# Patient Record
Sex: Female | Born: 1935 | Race: White | Hispanic: No | Smoking: Former smoker
Health system: Southern US, Community
[De-identification: ages and names within clinical notes are randomized; demographics above are authoritative.]

## PROBLEM LIST (undated history)

## (undated) DIAGNOSIS — R51 Headache: Secondary | ICD-10-CM

## (undated) DIAGNOSIS — R519 Headache, unspecified: Secondary | ICD-10-CM

## (undated) DIAGNOSIS — E079 Disorder of thyroid, unspecified: Secondary | ICD-10-CM

## (undated) DIAGNOSIS — M1990 Unspecified osteoarthritis, unspecified site: Secondary | ICD-10-CM

## (undated) DIAGNOSIS — I714 Abdominal aortic aneurysm, without rupture, unspecified: Secondary | ICD-10-CM

## (undated) DIAGNOSIS — Z9289 Personal history of other medical treatment: Secondary | ICD-10-CM

## (undated) DIAGNOSIS — I10 Essential (primary) hypertension: Secondary | ICD-10-CM

## (undated) DIAGNOSIS — K219 Gastro-esophageal reflux disease without esophagitis: Secondary | ICD-10-CM

## (undated) DIAGNOSIS — M810 Age-related osteoporosis without current pathological fracture: Secondary | ICD-10-CM

## (undated) DIAGNOSIS — J42 Unspecified chronic bronchitis: Secondary | ICD-10-CM

## (undated) DIAGNOSIS — T7840XA Allergy, unspecified, initial encounter: Secondary | ICD-10-CM

## (undated) DIAGNOSIS — E785 Hyperlipidemia, unspecified: Secondary | ICD-10-CM

## (undated) DIAGNOSIS — K635 Polyp of colon: Secondary | ICD-10-CM

## (undated) HISTORY — DX: Abdominal aortic aneurysm, without rupture, unspecified: I71.40

## (undated) HISTORY — DX: Unspecified osteoarthritis, unspecified site: M19.90

## (undated) HISTORY — DX: Disorder of thyroid, unspecified: E07.9

## (undated) HISTORY — PX: SHOULDER ARTHROSCOPY: SHX128

## (undated) HISTORY — DX: Polyp of colon: K63.5

## (undated) HISTORY — DX: Abdominal aortic aneurysm, without rupture: I71.4

## (undated) HISTORY — DX: Allergy, unspecified, initial encounter: T78.40XA

## (undated) HISTORY — DX: Hyperlipidemia, unspecified: E78.5

## (undated) HISTORY — DX: Essential (primary) hypertension: I10

## (undated) HISTORY — DX: Gastro-esophageal reflux disease without esophagitis: K21.9

## (undated) HISTORY — DX: Personal history of other medical treatment: Z92.89

## (undated) HISTORY — PX: NO PAST SURGERIES: SHX2092

## (undated) HISTORY — DX: Headache: R51

## (undated) HISTORY — DX: Unspecified chronic bronchitis: J42

## (undated) HISTORY — DX: Age-related osteoporosis without current pathological fracture: M81.0

## (undated) HISTORY — DX: Headache, unspecified: R51.9

## (undated) HISTORY — PX: HIP ARTHROPLASTY: SHX981

---

## 2016-12-28 DIAGNOSIS — M1990 Unspecified osteoarthritis, unspecified site: Secondary | ICD-10-CM | POA: Diagnosis not present

## 2017-01-20 DIAGNOSIS — J019 Acute sinusitis, unspecified: Secondary | ICD-10-CM | POA: Diagnosis not present

## 2017-04-02 DIAGNOSIS — J069 Acute upper respiratory infection, unspecified: Secondary | ICD-10-CM | POA: Diagnosis not present

## 2017-04-02 DIAGNOSIS — J209 Acute bronchitis, unspecified: Secondary | ICD-10-CM | POA: Diagnosis not present

## 2017-04-06 DIAGNOSIS — Z0000 Encounter for general adult medical examination without abnormal findings: Secondary | ICD-10-CM | POA: Diagnosis not present

## 2017-04-15 DIAGNOSIS — R05 Cough: Secondary | ICD-10-CM | POA: Diagnosis not present

## 2017-04-15 DIAGNOSIS — J209 Acute bronchitis, unspecified: Secondary | ICD-10-CM | POA: Diagnosis not present

## 2017-04-15 DIAGNOSIS — J069 Acute upper respiratory infection, unspecified: Secondary | ICD-10-CM | POA: Diagnosis not present

## 2017-04-19 DIAGNOSIS — K219 Gastro-esophageal reflux disease without esophagitis: Secondary | ICD-10-CM | POA: Diagnosis not present

## 2017-04-19 DIAGNOSIS — M1990 Unspecified osteoarthritis, unspecified site: Secondary | ICD-10-CM | POA: Diagnosis not present

## 2017-04-19 DIAGNOSIS — I10 Essential (primary) hypertension: Secondary | ICD-10-CM | POA: Diagnosis not present

## 2017-05-05 DIAGNOSIS — I714 Abdominal aortic aneurysm, without rupture: Secondary | ICD-10-CM | POA: Diagnosis not present

## 2017-06-07 DIAGNOSIS — Z1231 Encounter for screening mammogram for malignant neoplasm of breast: Secondary | ICD-10-CM | POA: Diagnosis not present

## 2017-06-23 DIAGNOSIS — M25511 Pain in right shoulder: Secondary | ICD-10-CM | POA: Diagnosis not present

## 2017-06-23 DIAGNOSIS — Z96611 Presence of right artificial shoulder joint: Secondary | ICD-10-CM | POA: Diagnosis not present

## 2017-06-23 DIAGNOSIS — M4722 Other spondylosis with radiculopathy, cervical region: Secondary | ICD-10-CM | POA: Diagnosis not present

## 2017-06-29 DIAGNOSIS — M4722 Other spondylosis with radiculopathy, cervical region: Secondary | ICD-10-CM | POA: Diagnosis not present

## 2017-07-07 DIAGNOSIS — M4722 Other spondylosis with radiculopathy, cervical region: Secondary | ICD-10-CM | POA: Diagnosis not present

## 2017-08-24 DIAGNOSIS — Z23 Encounter for immunization: Secondary | ICD-10-CM | POA: Diagnosis not present

## 2017-10-28 DIAGNOSIS — E039 Hypothyroidism, unspecified: Secondary | ICD-10-CM | POA: Diagnosis not present

## 2017-10-28 DIAGNOSIS — E7800 Pure hypercholesterolemia, unspecified: Secondary | ICD-10-CM | POA: Diagnosis not present

## 2017-10-28 DIAGNOSIS — I10 Essential (primary) hypertension: Secondary | ICD-10-CM | POA: Diagnosis not present

## 2018-01-18 ENCOUNTER — Encounter: Payer: Self-pay | Admitting: Family Medicine

## 2018-01-18 ENCOUNTER — Ambulatory Visit (INDEPENDENT_AMBULATORY_CARE_PROVIDER_SITE_OTHER): Payer: Medicare Other | Admitting: Family Medicine

## 2018-01-18 VITALS — BP 120/72 | HR 67 | Temp 98.5°F | Ht 65.5 in | Wt 204.5 lb

## 2018-01-18 DIAGNOSIS — E039 Hypothyroidism, unspecified: Secondary | ICD-10-CM | POA: Diagnosis not present

## 2018-01-18 DIAGNOSIS — J302 Other seasonal allergic rhinitis: Secondary | ICD-10-CM

## 2018-01-18 DIAGNOSIS — I10 Essential (primary) hypertension: Secondary | ICD-10-CM | POA: Insufficient documentation

## 2018-01-18 DIAGNOSIS — E785 Hyperlipidemia, unspecified: Secondary | ICD-10-CM | POA: Insufficient documentation

## 2018-01-18 DIAGNOSIS — R062 Wheezing: Secondary | ICD-10-CM | POA: Diagnosis not present

## 2018-01-18 MED ORDER — FLUTICASONE PROPIONATE HFA 110 MCG/ACT IN AERO: 2.0000 | INHALATION_SPRAY | Freq: Two times a day (BID) | RESPIRATORY_TRACT | 12 refills | Status: DC

## 2018-01-18 MED ORDER — ALBUTEROL SULFATE HFA 108 (90 BASE) MCG/ACT IN AERS: 1.0000 | INHALATION_SPRAY | Freq: Four times a day (QID) | RESPIRATORY_TRACT | 0 refills | Status: AC | PRN

## 2018-01-18 NOTE — Progress Notes (Signed)
Pre visit review using our clinic review tool, if applicable. No additional management support is needed unless otherwise documented below in the visit note. 

## 2018-01-18 NOTE — Progress Notes (Signed)
Chief Complaint  Patient presents with  . Establish Care       New Patient Visit SUBJECTIVE: HPI: Dawn Bush is an 84 y.o.female who is being seen for establishing care.  The patient was previously seen at [facility].  For several weeks, the patient is an issue with her allergies and wheezing.  She does not currently feel like she is wheezing.  She does not have a known history of asthma.  She has received albuterol and inhaled steroid in the past that has been helpful for such symptoms.  She is taking Allegra intermittently and has Flonase at home.  She is not using either routinely.  Denies fevers, sore throat, ear pain/drainage, myalgias, cough, or current shortness of breath/wheezing.  Hypertension Patient presents for hypertension follow up. She does monitor home blood pressures. She is compliant with medications. Patient has these side effects of medication: none She is sometimes adhering to a healthy diet overall. Exercise: none   Hyperlipidemia Patient presents for dyslipidemia follow up. Currently being treated with LIpitor 40 mg/d, and compliance with treatment thus far has been good. She denies myalgias. She is not adhering to a healthy. The patient exercises never.  The patient is not known to have coexisting coronary artery disease.  Hypothyroidism Patient presents for follow-up of hypothyroidism.  Reports compliance with medication. Current symptoms include: none Denies: denies fatigue, weight changes, heat/cold intolerance, bowel/skin changes or CVS symptoms She believes her dose should be unchanged  She is on Lexapro for anxiety which she started after stopping smoking cigarettes.  She is cut down from 20 mg daily to 10 mg daily.  She does not wish to cut down any further.  Allergies  Allergen Reactions  . Azithromycin Other (See Comments)    Past Medical History:  Diagnosis Date  . Allergy   . Arthritis   . Chronic bronchitis ([place])   . Colon polyps    . GERD (gastroesophageal reflux disease)   . Headache   . History of blood transfusion   . Hyperlipidemia   . Hypertension   . Thyroid disease    History reviewed. No pertinent surgical history. Social History   Socioeconomic History  . Marital status: Widowed  Tobacco Use  . Smoking status: Former Smoker    Types: Cigarettes  . Smokeless tobacco: Never Used  Substance and Sexual Activity  . Alcohol use: No    Frequency: Never  . Drug use: No   History reviewed. No pertinent family history.   Current Outpatient Medications:  .  amLODipine (NORVASC) 5 MG tablet, Take 5 mg by mouth daily., Disp: , Rfl:  .  atorvastatin (LIPITOR) 40 MG tablet, Take 40 mg by mouth daily., Disp: , Rfl:  .  escitalopram (LEXAPRO) 10 MG tablet, Take 10 mg by mouth daily., Disp: , Rfl:  .  hyoscyamine (LEVBID) 0.375 MG 12 hr tablet, Take 0.375 mg by mouth daily., Disp: , Rfl:  .  levothyroxine (SYNTHROID, LEVOTHROID) 112 MCG tablet, Take 112 mcg by mouth daily before breakfast., Disp: , Rfl:  .  loratadine (ALLERGY) 10 MG tablet, Take 10 mg by mouth daily., Disp: , Rfl:  .  meloxicam (MOBIC) 7.5 MG tablet, Take 7.5 mg by mouth daily., Disp: , Rfl:  .  metoprolol tartrate (LOPRESSOR) 50 MG tablet, Take 50 mg by mouth 2 (two) times daily., Disp: , Rfl:  .  omeprazole (PRILOSEC) 20 MG capsule, Take 20 mg by mouth daily., Disp: , Rfl:  .  albuterol (VENTOLIN HFA)  108 (90 Base) MCG/ACT inhaler, Inhale 1-2 puffs into the lungs every 6 (six) hours as needed for wheezing or shortness of breath., Disp: 1 Inhaler, Rfl: 0 .  fluticasone (FLOVENT HFA) 110 MCG/ACT inhaler, Inhale 2 puffs into the lungs 2 (two) times daily., Disp: 1 Inhaler, Rfl: 12  ROS 10 point review of system is negative unless otherwise noted in the HPI  OBJECTIVE: BP 120/72 (BP Location: Left Arm, Patient Position: Sitting, Cuff Size: Large)   Pulse 67   Temp 98.5 F (36.9 C) (Oral)   Ht 5' 5.5" (1.664 m)   Wt 204 lb 8 oz (92.8 kg)    SpO2 95%   BMI 33.51 kg/m   Constitutional: -  VS reviewed -  Well developed, well nourished, appears stated age -  No apparent distress  Psychiatric: -  Oriented to person, place, and time -  Memory intact -  Affect and mood normal -  Fluent conversation, good eye contact -  Judgment and insight age appropriate  Eye: -  Conjunctivae clear, no discharge -  Pupils symmetric, round, reactive to light  ENMT: -  MMM    Pharynx moist, no exudate, no erythema -  Ears neg b/l -  Nares patent, rhinorrhea noted b/l  Neck: -  No gross swelling, no palpable masses -  Thyroid midline, not enlarged, mobile, no palpable masses  Cardiovascular: -  RRR -  No LE edema  Respiratory: -  Normal respiratory effort, no accessory muscle use, no retraction -  Breath sounds equal, no wheezes, no ronchi, no crackles  Skin: -  No significant lesion on inspection -  Warm and dry to palpation   ASSESSMENT/PLAN: Seasonal allergies - Plan: albuterol (VENTOLIN HFA) 108 (90 Base) MCG/ACT inhaler, fluticasone (FLOVENT HFA) 110 MCG/ACT inhaler  Wheezing - Plan: albuterol (VENTOLIN HFA) 108 (90 Base) MCG/ACT inhaler, fluticasone (FLOVENT HFA) 110 MCG/ACT inhaler  Essential hypertension - Plan: metoprolol tartrate (LOPRESSOR) 50 MG tablet, amLODipine (NORVASC) 5 MG tablet  Hyperlipidemia, unspecified hyperlipidemia type - Plan: atorvastatin (LIPITOR) 40 MG tablet  Hypothyroidism, unspecified type - Plan: levothyroxine (SYNTHROID, LEVOTHROID) 112 MCG tablet  Patient instructed to sign release of records form from her previous PCP. Stronger oral antihistamine suggested, start Flonase.  Albuterol given for as needed use regarding breathing and wheezing.  If she is using this frequently, can use the inhaled corticosteroid.  She was advised to rinse her mouth out after use. Patient should return in 6 mo for med check. The patient voiced understanding and agreement to the plan.   [place],  DO 01/18/18  4:49 PM

## 2018-01-18 NOTE — Patient Instructions (Signed)
Claritin (loratadine), Allegra (fexofenadine), Zyrtec (cetirizine); these are listed in order from weakest to strongest. Generic, and therefore cheaper, options are in the parentheses.   Flonase (fluticasone); nasal spray that is over the counter. 2 sprays each nostril, once daily. Aim towards the same side eye when you spray.  There are available OTC, and the generic versions, which may be cheaper, are in parentheses. Show this to a pharmacist if you have trouble finding any of these items.  Remember to rinse your mouth out after using the inhaled steroid.   Use the albuterol (rescue inhaler) if you feel like you are wheezing or short of breath.  Call if you need refills, don't make an appointment. That is what the 6 month follow up is for.   Let us know if you need anything.

## 2018-01-19 ENCOUNTER — Encounter: Payer: Self-pay | Admitting: Family Medicine

## 2018-01-24 DIAGNOSIS — J209 Acute bronchitis, unspecified: Secondary | ICD-10-CM | POA: Diagnosis not present

## 2018-01-26 ENCOUNTER — Telehealth: Payer: Self-pay | Admitting: *Deleted

## 2018-01-26 NOTE — Telephone Encounter (Signed)
Received Medical records from [facility]; forwarded to provider/SLS 03/28

## 2018-02-14 ENCOUNTER — Telehealth: Payer: Self-pay | Admitting: Family Medicine

## 2018-02-14 DIAGNOSIS — E039 Hypothyroidism, unspecified: Secondary | ICD-10-CM

## 2018-02-14 NOTE — Telephone Encounter (Signed)
Copied from [place]. Topic: Quick Communication - Rx Refill/Question >> Feb 14, 2018  3:06 PM Synthia Innocent wrote: Medication: meloxicam (MOBIC) 7.5 MG tablet and levothyroxine (SYNTHROID, LEVOTHROID) 112 MCG tablet   Has the patient contacted their pharmacy? No, provider has never filled before  (Agent: If no, request that the patient contact the pharmacy for the refill.) Preferred Pharmacy (with phone number or street name): [facility]  Agent: Please be advised that RX refills may take up to 3 business days. We ask that you follow-up with your pharmacy.

## 2018-02-15 MED ORDER — LEVOTHYROXINE SODIUM 112 MCG PO TABS: 112.0000 ug | ORAL_TABLET | Freq: Every day | ORAL | 3 refills | Status: DC

## 2018-02-15 MED ORDER — MELOXICAM 7.5 MG PO TABS: 7.5000 mg | ORAL_TABLET | Freq: Every day | ORAL | 1 refills | Status: DC

## 2018-02-15 NOTE — Telephone Encounter (Signed)
Pt requesting meloxicam and levothyroxine. Both are listed on the her med list but no provider listed. LOV  01/18/18 as new patient visit. pcp   Dr. Nani Ravens  Please review.

## 2018-02-15 NOTE — Telephone Encounter (Signed)
Refills done/Dr. Nani Ravens is her provider. Called the patient to inform refills are done.

## 2018-03-10 ENCOUNTER — Telehealth: Payer: Self-pay | Admitting: Family Medicine

## 2018-03-10 DIAGNOSIS — E785 Hyperlipidemia, unspecified: Secondary | ICD-10-CM

## 2018-03-10 DIAGNOSIS — I10 Essential (primary) hypertension: Secondary | ICD-10-CM

## 2018-03-10 DIAGNOSIS — E039 Hypothyroidism, unspecified: Secondary | ICD-10-CM

## 2018-03-10 MED ORDER — HYOSCYAMINE SULFATE ER 0.375 MG PO TB12: 0.3750 mg | ORAL_TABLET | Freq: Every day | ORAL | 1 refills | Status: DC

## 2018-03-10 MED ORDER — METOPROLOL TARTRATE 50 MG PO TABS: 50.0000 mg | ORAL_TABLET | Freq: Two times a day (BID) | ORAL | 1 refills | Status: DC

## 2018-03-10 MED ORDER — OMEPRAZOLE 20 MG PO CPDR: 20.0000 mg | DELAYED_RELEASE_CAPSULE | Freq: Every day | ORAL | 1 refills | Status: DC

## 2018-03-10 MED ORDER — ATORVASTATIN CALCIUM 40 MG PO TABS: 40.0000 mg | ORAL_TABLET | Freq: Every day | ORAL | 1 refills | Status: DC

## 2018-03-10 MED ORDER — AMLODIPINE BESYLATE 5 MG PO TABS: 5.0000 mg | ORAL_TABLET | Freq: Every day | ORAL | 1 refills | Status: DC

## 2018-03-10 MED ORDER — LEVOTHYROXINE SODIUM 112 MCG PO TABS: 112.0000 ug | ORAL_TABLET | Freq: Every day | ORAL | 1 refills | Status: DC

## 2018-03-10 MED ORDER — MELOXICAM 7.5 MG PO TABS: 7.5000 mg | ORAL_TABLET | Freq: Every day | ORAL | 1 refills | Status: DC

## 2018-03-10 MED ORDER — ESCITALOPRAM OXALATE 10 MG PO TABS: 10.0000 mg | ORAL_TABLET | Freq: Every day | ORAL | 1 refills | Status: DC

## 2018-03-10 NOTE — Telephone Encounter (Signed)
OK to refill. TY.

## 2018-03-10 NOTE — Telephone Encounter (Signed)
Copied from [place] [id number]. Topic: Quick Communication - Rx Refill/Question >> Mar 10, 2018 10:05 AM Synthia Innocent wrote: Medication: levothyroxine (SYNTHROID, LEVOTHROID) 112 MCG tablet, escitalopram (LEXAPRO) 10 MG tablet, metoprolol tartrate (LOPRESSOR) 50 MG tablet, atorvastatin (LIPITOR) 40 MG tablet, meloxicam (MOBIC) 7.5 MG tablet, amLODipine (NORVASC) 5 MG tablet, hyoscyamine (LEVBID) 0.375 MG 12 hr, and omeprazole (PRILOSEC) 20 MG capsule  Has the patient contacted their pharmacy? Yes, new prescriber (Agent: If no, request that the patient contact the pharmacy for the refill.) Preferred Pharmacy (with phone number or street name): [facility] [address] Agent: Please be advised that RX refills may take up to 3 business days. We ask that you follow-up with your pharmacy.

## 2018-03-10 NOTE — Telephone Encounter (Signed)
Refills sent in as requested. 

## 2018-03-10 NOTE — Addendum Note (Signed)
Addended by: Sharon Seller B on: 03/10/2018 11:52 AM   Modules accepted: Orders

## 2018-05-01 DIAGNOSIS — R52 Pain, unspecified: Secondary | ICD-10-CM | POA: Diagnosis not present

## 2018-05-02 DIAGNOSIS — Z96642 Presence of left artificial hip joint: Secondary | ICD-10-CM | POA: Diagnosis not present

## 2018-05-02 DIAGNOSIS — I10 Essential (primary) hypertension: Secondary | ICD-10-CM | POA: Diagnosis not present

## 2018-05-02 DIAGNOSIS — Z885 Allergy status to narcotic agent status: Secondary | ICD-10-CM | POA: Diagnosis not present

## 2018-05-02 DIAGNOSIS — R0789 Other chest pain: Secondary | ICD-10-CM | POA: Diagnosis not present

## 2018-05-02 DIAGNOSIS — Z79899 Other long term (current) drug therapy: Secondary | ICD-10-CM | POA: Diagnosis not present

## 2018-05-02 DIAGNOSIS — Z96612 Presence of left artificial shoulder joint: Secondary | ICD-10-CM | POA: Diagnosis not present

## 2018-05-02 DIAGNOSIS — M5489 Other dorsalgia: Secondary | ICD-10-CM | POA: Diagnosis not present

## 2018-05-02 DIAGNOSIS — S20212A Contusion of left front wall of thorax, initial encounter: Secondary | ICD-10-CM | POA: Diagnosis not present

## 2018-05-02 DIAGNOSIS — I959 Hypotension, unspecified: Secondary | ICD-10-CM | POA: Diagnosis not present

## 2018-05-02 DIAGNOSIS — R079 Chest pain, unspecified: Secondary | ICD-10-CM | POA: Diagnosis not present

## 2018-05-02 DIAGNOSIS — R0781 Pleurodynia: Secondary | ICD-10-CM | POA: Diagnosis not present

## 2018-05-02 DIAGNOSIS — Z791 Long term (current) use of non-steroidal anti-inflammatories (NSAID): Secondary | ICD-10-CM | POA: Diagnosis not present

## 2018-05-02 DIAGNOSIS — G8911 Acute pain due to trauma: Secondary | ICD-10-CM | POA: Diagnosis not present

## 2018-05-02 DIAGNOSIS — Z96611 Presence of right artificial shoulder joint: Secondary | ICD-10-CM | POA: Diagnosis not present

## 2018-05-15 ENCOUNTER — Ambulatory Visit: Payer: Self-pay | Admitting: Family Medicine

## 2018-05-15 ENCOUNTER — Emergency Department (HOSPITAL_BASED_OUTPATIENT_CLINIC_OR_DEPARTMENT_OTHER): Payer: Medicare Other

## 2018-05-15 ENCOUNTER — Encounter (HOSPITAL_BASED_OUTPATIENT_CLINIC_OR_DEPARTMENT_OTHER): Payer: Self-pay | Admitting: Emergency Medicine

## 2018-05-15 ENCOUNTER — Emergency Department (HOSPITAL_BASED_OUTPATIENT_CLINIC_OR_DEPARTMENT_OTHER)
Admission: EM | Admit: 2018-05-15 | Discharge: 2018-05-15 | Disposition: A | Discharge status: Home / Self Care | Payer: Medicare Other | Attending: Emergency Medicine | Admitting: Emergency Medicine

## 2018-05-15 ENCOUNTER — Other Ambulatory Visit: Payer: Self-pay

## 2018-05-15 DIAGNOSIS — R0789 Other chest pain: Secondary | ICD-10-CM | POA: Diagnosis not present

## 2018-05-15 DIAGNOSIS — Y939 Activity, unspecified: Secondary | ICD-10-CM | POA: Insufficient documentation

## 2018-05-15 DIAGNOSIS — Y999 Unspecified external cause status: Secondary | ICD-10-CM | POA: Diagnosis not present

## 2018-05-15 DIAGNOSIS — E039 Hypothyroidism, unspecified: Secondary | ICD-10-CM | POA: Diagnosis not present

## 2018-05-15 DIAGNOSIS — M79602 Pain in left arm: Secondary | ICD-10-CM

## 2018-05-15 DIAGNOSIS — Z87891 Personal history of nicotine dependence: Secondary | ICD-10-CM | POA: Diagnosis not present

## 2018-05-15 DIAGNOSIS — Y9234 Swimming pool (public) as the place of occurrence of the external cause: Secondary | ICD-10-CM | POA: Diagnosis not present

## 2018-05-15 DIAGNOSIS — Z79899 Other long term (current) drug therapy: Secondary | ICD-10-CM | POA: Insufficient documentation

## 2018-05-15 DIAGNOSIS — M25512 Pain in left shoulder: Secondary | ICD-10-CM | POA: Diagnosis not present

## 2018-05-15 DIAGNOSIS — W010XXD Fall on same level from slipping, tripping and stumbling without subsequent striking against object, subsequent encounter: Secondary | ICD-10-CM | POA: Insufficient documentation

## 2018-05-15 DIAGNOSIS — I10 Essential (primary) hypertension: Secondary | ICD-10-CM | POA: Insufficient documentation

## 2018-05-15 DIAGNOSIS — S4992XA Unspecified injury of left shoulder and upper arm, initial encounter: Secondary | ICD-10-CM | POA: Diagnosis not present

## 2018-05-15 NOTE — ED Provider Notes (Signed)
[facility] EMERGENCY DEPARTMENT Provider Note   CSN: [id number] Arrival date & time: 05/15/18  0932     History   Chief Complaint Chief Complaint  Patient presents with  . Fall    HPI Dawn Bush is a 84 y.o. female.  84 year old female with past medical history below including hypertension, hyperlipidemia, GERD who presents with left chest wall and left arm pain.  2 weeks ago, she was at the pool and slipped and fell onto her left side.  Since then, she has had a moderate to severe, constant left chest wall pain and left arm pain that goes down her whole arm.  She did not strike her head or lose consciousness and has had no vomiting or vision changes.  She was evaluated at an urgent care the following day.  She continued to have pain so several days later presented to an ER where she had normal CT of the chest.  She has been taking meloxicam with no relief of her symptoms.  She returns today because her pain is still present.  No other areas of pain.  No anticoagulant use.  No breathing problems although deep breathing makes pain worse.  The history is provided by the patient.  Fall     Past Medical History:  Diagnosis Date  . Allergy   . Arthritis   . Chronic bronchitis ([place])   . Colon polyps   . GERD (gastroesophageal reflux disease)   . Headache   . History of blood transfusion   . Hyperlipidemia   . Hypertension   . Thyroid disease     Patient Active Problem List   Diagnosis Date Noted  . Essential hypertension 01/18/2018  . Hyperlipidemia 01/18/2018  . Hypothyroidism 01/18/2018    Past Surgical History:  Procedure Laterality Date  . NO PAST SURGERIES       OB History   None      Home Medications    Prior to Admission medications   Medication Sig Start Date End Date Taking? Authorizing Provider  albuterol (VENTOLIN HFA) 108 (90 Base) MCG/ACT inhaler Inhale 1-2 puffs into the lungs every 6 (six) hours as needed for wheezing or shortness  of breath. 01/18/18   Wendling, Crosby Oyster, DO  amLODipine (NORVASC) 5 MG tablet Take 1 tablet (5 mg total) by mouth daily. 03/10/18   Shelda Pal, DO  atorvastatin (LIPITOR) 40 MG tablet Take 1 tablet (40 mg total) by mouth daily. 03/10/18   Shelda Pal, DO  escitalopram (LEXAPRO) 10 MG tablet Take 1 tablet (10 mg total) by mouth daily. 03/10/18   Shelda Pal, DO  fluticasone (FLOVENT HFA) 110 MCG/ACT inhaler Inhale 2 puffs into the lungs 2 (two) times daily. 01/18/18   Shelda Pal, DO  hyoscyamine (LEVBID) 0.375 MG 12 hr tablet Take 1 tablet (0.375 mg total) by mouth daily. 03/10/18   Shelda Pal, DO  levothyroxine (SYNTHROID, LEVOTHROID) 112 MCG tablet Take 1 tablet (112 mcg total) by mouth daily before breakfast. 03/10/18   Wendling, Crosby Oyster, DO  loratadine (ALLERGY) 10 MG tablet Take 10 mg by mouth daily.    [provider]  meloxicam (MOBIC) 7.5 MG tablet Take 1 tablet (7.5 mg total) by mouth daily. 03/10/18   Shelda Pal, DO  metoprolol tartrate (LOPRESSOR) 50 MG tablet Take 1 tablet (50 mg total) by mouth 2 (two) times daily. 03/10/18   Shelda Pal, DO  omeprazole (PRILOSEC) 20 MG capsule Take 1 capsule (  20 mg total) by mouth daily. 03/10/18   Shelda Pal, DO    Family History Family History  Problem Relation Age of Onset  . Cancer Neg Hx     Social History Social History   Tobacco Use  . Smoking status: Former Smoker    Types: Cigarettes  . Smokeless tobacco: Never Used  Substance Use Topics  . Alcohol use: No    Frequency: Never  . Drug use: No     Allergies   Azithromycin   Review of Systems Review of Systems All other systems reviewed and are negative except that which was mentioned in HPI   Physical Exam Updated Vital Signs BP (!) 146/60 (BP Location: Right Arm)   Pulse 98   Temp 97.9 F (36.6 C) (Oral)   Ht 5\' 5"  (1.651 m)   Wt 90.7 kg (200 lb)   SpO2  92%   BMI 33.28 kg/m   Physical Exam  Constitutional: She is oriented to person, place, and time. She appears well-developed and well-nourished. No distress.  HENT:  Head: Normocephalic and atraumatic.  Moist mucous membranes  Eyes: Conjunctivae are normal.  Neck: Neck supple.  Cardiovascular: Normal rate, regular rhythm and normal heart sounds.  No murmur heard. Pulmonary/Chest: Effort normal and breath sounds normal. She exhibits no tenderness.  Abdominal: Soft. Bowel sounds are normal. She exhibits no distension. There is no tenderness.  Musculoskeletal: Normal range of motion. She exhibits no edema or tenderness.  Pain with raising shoulder above 90 degrees  Neurological: She is alert and oriented to person, place, and time.  Fluent speech  Skin: Skin is warm and dry.  Peeling skin from sunburn on upper back  Psychiatric: She has a normal mood and affect.  Nursing note and vitals reviewed.    ED Treatments / Results  Labs (all labs ordered are listed, but only abnormal results are displayed) Labs Reviewed - No data to display  EKG None  Radiology Dg Shoulder Left  Result Date: 05/15/2018 CLINICAL DATA:  [place] 2 weeks ago and tried to catch herself with LEFT arm and landed on LEFT side, pain all over LEFT shoulder EXAM: LEFT SHOULDER - 2+ VIEW COMPARISON:  None FINDINGS: Osseous demineralization. Question mild widening of AC joint, acromion margin poorly visualized. LEFT shoulder prosthesis. No acute fracture, dislocation, or bone destruction. No periprosthetic lucency or LEFT rib abnormality. IMPRESSION: LEFT shoulder prosthesis and osseous demineralization without definite acute bony findings. Electronically Signed   By: Lavonia Dana M.D.   On: 05/15/2018 09:10    Procedures Procedures (including critical care time)  Medications Ordered in ED Medications - No data to display   Initial Impression / Assessment and Plan / ED Course  I have reviewed the triage vital  signs and the nursing notes.  Pertinent labs & imaging results that were available during my care of the patient were reviewed by me and considered in my medical decision making (see chart for details).     Reassuring VS, no focal chest wall tenderness on exam. I reviewed OSH work up which shows normal CT chest, no rib fractures. Given this, I do not feel she needs chest imaging today as symptoms are the same ongoing sx. I did obtain L shoulder XR which was negative acute. Discussed supportive measures, provided w/ IS to help prevent pneumonia, and recommended adding tylenol to meloxicam regimen at home. Return precautions reviewed.  Final Clinical Impressions(s) / ED Diagnoses   Final diagnoses:  Left-sided chest wall pain  Left arm pain    ED Discharge Orders    None       Little, Wenda Overland, MD 05/15/18 [phone]

## 2018-05-15 NOTE — Telephone Encounter (Signed)
Called in c/o her whole left arm being numb since she woke up this morning.  She can feel things and hold a glass of water with left arm.   She can move it fine it's "just numb".  She fell at the pool 2 weeks ago on her left side.   She had her arm x-rayed at the urgent care and everything was fine.   Her ribs on the left side are sore too.   She was not getting better so went to the ED on 05/02/18 in [place].    They did a CT scan and everything was fine.   She is concerned she is having this left arm numbness that is new.  I have referred her to the ED.   She is going to the [facility] ED now.  I routed a note to Dr. Nani Ravens making him aware.    Reason for Disposition . [1] Numbness (i.e., loss of sensation) of the face, arm / hand, or leg / foot on one side of the body AND [2] sudden onset AND [3] present now  Answer Assessment - Initial Assessment Questions 1. SYMPTOM: "What is the main symptom you are concerned about?" (e.g., weakness, numbness)     Left arm feeling numb. 2. ONSET: "When did this start?" (minutes, hours, days; while sleeping)     I woke up with it numb this morning.   My whole arm is numb. 3. LAST NORMAL: "When was the last time you were normal (no symptoms)?"     Last night it was fine. 4. PATTERN "Does this come and go, or has it been constant since it started?"  "Is it present now?"     Constant.   No tingling. 5. CARDIAC SYMPTOMS: "Have you had any of the following symptoms: chest pain, difficulty breathing, palpitations?"     No stroke history.   My heart skips beats.  I'm on medication for it.   When I breath it hurts because of my ribs.  I fell at the pool 2 weeks ago.   I went to urgent care and nothing was broken.   It got worse on Wed. So I went to [facility] and the CT scan showed everything is ok.   I don't know why I'm still hurting.   It's been 15 days. 6. NEUROLOGIC SYMPTOMS: "Have you had any of the following symptoms: headache,  dizziness, vision loss, double vision, changes in speech, unsteady on your feet?"     No other symptoms. 7. OTHER SYMPTOMS: "Do you have any other symptoms?"     Bruised my left hip when fell and bruises on my arm.   They x-rayed my left arm and everything was ok.   8. PREGNANCY: "Is there any chance you are pregnant?" "When was your last menstrual period?"     Not asked due to age  Protocols used: NEUROLOGIC DEFICIT-A-AH

## 2018-05-15 NOTE — ED Notes (Signed)
ED Provider at bedside. 

## 2018-05-15 NOTE — ED Notes (Signed)
IS given to patient. Stated she had used one in the past. 1250x5. Placed in a bag and encourage to use at home.

## 2018-05-15 NOTE — ED Triage Notes (Signed)
Pt fell 2 weeks ago and sts she was seen and x-rayed  With  No injuries found.  Continues to have pain in left side of chest, left breast,left side of upper back and left arm.  There is no swelling or deformity. Skin is intact.  Denies any lower body involvement. Moves left arm well.

## 2018-05-17 ENCOUNTER — Telehealth: Payer: Self-pay | Admitting: *Deleted

## 2018-05-17 DIAGNOSIS — H53483 Generalized contraction of visual field, bilateral: Secondary | ICD-10-CM | POA: Diagnosis not present

## 2018-05-17 DIAGNOSIS — H43813 Vitreous degeneration, bilateral: Secondary | ICD-10-CM | POA: Diagnosis not present

## 2018-05-17 DIAGNOSIS — H353111 Nonexudative age-related macular degeneration, right eye, early dry stage: Secondary | ICD-10-CM | POA: Diagnosis not present

## 2018-05-17 DIAGNOSIS — H16223 Keratoconjunctivitis sicca, not specified as Sjogren's, bilateral: Secondary | ICD-10-CM | POA: Diagnosis not present

## 2018-05-17 NOTE — Telephone Encounter (Signed)
Received Medical records from [facility]; forwarded to provider/SLS 07/17

## 2018-05-18 ENCOUNTER — Telehealth: Payer: Self-pay | Admitting: Family Medicine

## 2018-05-18 NOTE — Telephone Encounter (Signed)
PCV 04-02-16 Tdap 04-02-16 Z[place] 10-31-14 Reportedly UTD w imms?

## 2018-06-02 ENCOUNTER — Telehealth: Payer: Self-pay | Admitting: Family Medicine

## 2018-06-02 DIAGNOSIS — I719 Aortic aneurysm of unspecified site, without rupture: Secondary | ICD-10-CM

## 2018-06-02 DIAGNOSIS — Z0000 Encounter for general adult medical examination without abnormal findings: Secondary | ICD-10-CM

## 2018-06-02 NOTE — Telephone Encounter (Signed)
Copied from [place] [id number]. Topic: Quick Communication - See Telephone Encounter >> Jun 02, 2018  5:16 PM Neva Seat wrote: Pt needing to be referred to a vein vascular doctor and for a mammogram.

## 2018-06-05 NOTE — Telephone Encounter (Signed)
Pt. requesting mammogram and referral for vein vascular. Routed to Dr. Nani Ravens to review.

## 2018-06-07 NOTE — Telephone Encounter (Signed)
We can order a mammogram, however do not recommend screening after the age of 76.  Her last mammogram was normal.  If she still would like it,  okay to place order.  I am okay to refer to a vascular surgeon, however would need to know why.

## 2018-06-07 NOTE — Addendum Note (Signed)
Addended by: Sharon Seller B on: 06/07/2018 01:04 PM   Modules accepted: Orders

## 2018-06-07 NOTE — Telephone Encounter (Signed)
Referral done. Patient aware

## 2018-06-20 ENCOUNTER — Other Ambulatory Visit: Payer: Self-pay

## 2018-06-20 DIAGNOSIS — I714 Abdominal aortic aneurysm, without rupture, unspecified: Secondary | ICD-10-CM

## 2018-08-11 DIAGNOSIS — Z87891 Personal history of nicotine dependence: Secondary | ICD-10-CM | POA: Diagnosis not present

## 2018-08-11 DIAGNOSIS — K219 Gastro-esophageal reflux disease without esophagitis: Secondary | ICD-10-CM | POA: Diagnosis not present

## 2018-08-15 ENCOUNTER — Other Ambulatory Visit: Payer: Self-pay

## 2018-08-15 ENCOUNTER — Encounter: Payer: Self-pay | Admitting: Vascular Surgery

## 2018-08-15 ENCOUNTER — Ambulatory Visit (HOSPITAL_COMMUNITY)
Admission: RE | Admit: 2018-08-15 | Discharge: 2018-08-15 | Disposition: A | Discharge status: Home / Self Care | Payer: Medicare Other | Source: Ambulatory Visit | Attending: Vascular Surgery | Admitting: Vascular Surgery

## 2018-08-15 ENCOUNTER — Ambulatory Visit (INDEPENDENT_AMBULATORY_CARE_PROVIDER_SITE_OTHER): Payer: Medicare Other | Admitting: Vascular Surgery

## 2018-08-15 VITALS — BP 110/60 | HR 54 | Temp 98.4°F | Resp 16 | Ht 65.0 in | Wt 199.6 lb

## 2018-08-15 DIAGNOSIS — I714 Abdominal aortic aneurysm, without rupture, unspecified: Secondary | ICD-10-CM
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
## 2018-09-07 DIAGNOSIS — R05 Cough: Secondary | ICD-10-CM | POA: Diagnosis not present

## 2018-09-12 DIAGNOSIS — J189 Pneumonia, unspecified organism: Secondary | ICD-10-CM | POA: Diagnosis not present

## 2018-10-11 ENCOUNTER — Other Ambulatory Visit: Payer: Self-pay | Admitting: Family Medicine

## 2018-10-11 NOTE — Telephone Encounter (Signed)
Copied from [place] [id number]. Topic: Quick Communication - Rx Refill/Question >> Oct 11, 2018  9:59 AM Percell Belt A wrote: Medication: escitalopram (LEXAPRO) 10 MG tablet [[id number]]   Has the patient contacted their pharmacy? Yes- pt stated that pharmacy told her it has been denied , pt is unsure why it was denied  (Agent: If no, request that the patient contact the pharmacy for the refill.) (Agent: If yes, when and what did the pharmacy advise?)  Preferred Pharmacy (with phone number or street name): [id number] - [place], [place] - [address] AT [address] [place] [phone] (Phone   Agent: Please be advised that RX refills may take up to 3 business days. We ask that you follow-up with your pharmacy.

## 2018-10-12 MED ORDER — ESCITALOPRAM OXALATE 10 MG PO TABS: 10.0000 mg | ORAL_TABLET | Freq: Every day | ORAL | 0 refills | Status: DC

## 2018-10-12 NOTE — Telephone Encounter (Signed)
30 day refill approved. TC to patient appointment made for 10/19/18 to adhere to protocol standards for refills on this medication. Requested Prescriptions  Pending Prescriptions Disp Refills  . escitalopram (LEXAPRO) 10 MG tablet 30 tablet 0    Sig: Take 1 tablet (10 mg total) by mouth daily.     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

## 2018-10-17 ENCOUNTER — Other Ambulatory Visit: Payer: Self-pay | Admitting: Family Medicine

## 2018-10-18 NOTE — Progress Notes (Signed)
Subjective:   Dawn Bush is a 84 y.o. female who presents for an Initial Medicare Annual Wellness Visit.  Review of Systems   No ROS.  Medicare Wellness Visit. Additional risk factors are reflected in the social history. Cardiac Risk Factors include: advanced age (>21men, >73 women);dyslipidemia;hypertension Sleep patterns:  Home Safety/Smoke Alarms: Feels safe in home. Smoke alarms in place. Lives alone in 1st floor apt. Pt has 2 dogs. Step over tub with grab rails.   Female:        Mammo- ordered   Dexa scan- ordered          Objective:    Today's Vitals   10/19/18 1354  BP: 132/82  Pulse: (!) 56  SpO2: 93%  Weight: 199 lb (90.3 kg)  Height: 5\' 5"  (1.651 m)   Body mass index is 33.12 kg/m.  Advanced Directives 10/19/2018 08/15/2018 05/15/2018  Does Patient Have a Medical Advance Directive? No Yes No  Type of Advance Directive - [place] -  Does patient want to make changes to medical advance directive? - No - Patient declined -  Copy of [place] in Chart? - No - copy requested -  Would patient like information on creating a medical advance directive? No - Patient declined - No - Patient declined    Current Medications (verified) Outpatient Encounter Medications as of 10/19/2018  Medication Sig  . albuterol (VENTOLIN HFA) 108 (90 Base) MCG/ACT inhaler Inhale 1-2 puffs into the lungs every 6 (six) hours as needed for wheezing or shortness of breath.  Marland Kitchen amLODipine (NORVASC) 5 MG tablet Take 1 tablet (5 mg total) by mouth daily.  Marland Kitchen atorvastatin (LIPITOR) 40 MG tablet Take 1 tablet (40 mg total) by mouth daily.  Marland Kitchen escitalopram (LEXAPRO) 10 MG tablet Take 1 tablet (10 mg total) by mouth daily.  . hyoscyamine (LEVBID) 0.375 MG 12 hr tablet Take 1 tablet (0.375 mg total) by mouth daily.  Marland Kitchen levothyroxine (SYNTHROID, LEVOTHROID) 112 MCG tablet Take 1 tablet (112 mcg total) by mouth daily before breakfast.  . meloxicam (MOBIC) 7.5 MG  tablet TAKE 1 TABLET BY MOUTH EVERY DAY  . metoprolol tartrate (LOPRESSOR) 50 MG tablet Take 1 tablet (50 mg total) by mouth 2 (two) times daily.  Marland Kitchen omeprazole (PRILOSEC) 20 MG capsule Take 1 capsule (20 mg total) by mouth 2 (two) times daily before a meal.  . umeclidinium bromide (INCRUSE ELLIPTA) 62.5 MCG/INH AEPB Inhale 1 puff into the lungs daily.  . [DISCONTINUED] amLODipine (NORVASC) 5 MG tablet Take 1 tablet (5 mg total) by mouth daily.  . [DISCONTINUED] atorvastatin (LIPITOR) 40 MG tablet Take 1 tablet (40 mg total) by mouth daily.  . [DISCONTINUED] escitalopram (LEXAPRO) 10 MG tablet Take 1 tablet (10 mg total) by mouth daily.  . [DISCONTINUED] fluticasone (FLOVENT HFA) 110 MCG/ACT inhaler Inhale 2 puffs into the lungs 2 (two) times daily.  . [DISCONTINUED] levothyroxine (SYNTHROID, LEVOTHROID) 112 MCG tablet Take 1 tablet (112 mcg total) by mouth daily before breakfast.  . [DISCONTINUED] loratadine (ALLERGY) 10 MG tablet Take 10 mg by mouth daily.  . [DISCONTINUED] metoprolol tartrate (LOPRESSOR) 50 MG tablet Take 1 tablet (50 mg total) by mouth 2 (two) times daily.  . [DISCONTINUED] omeprazole (PRILOSEC) 20 MG capsule Take 1 capsule (20 mg total) by mouth daily.   No facility-administered encounter medications on file as of 10/19/2018.     Allergies (verified) Erythromycin; Other; Codeine; Azithromycin; Banana; and Prednisone   History: Past Medical History:  Diagnosis Date  . AAA (abdominal aortic aneurysm) ([place])   . Allergy   . Arthritis   . Chronic bronchitis ([place])   . Colon polyps   . GERD (gastroesophageal reflux disease)   . Headache   . History of blood transfusion   . Hyperlipidemia   . Hypertension   . Thyroid disease    Past Surgical History:  Procedure Laterality Date  . HIP ARTHROPLASTY Left   . NO PAST SURGERIES    . SHOULDER ARTHROSCOPY Bilateral    Family History  Problem Relation Age of Onset  . Heart disease Father   . AAA (abdominal aortic  aneurysm) Father   . Heart disease Brother   . AAA (abdominal aortic aneurysm) Brother   . Cancer Neg Hx    Social History   Socioeconomic History  . Marital status: Widowed    Spouse name: Not on file  . Number of children: Not on file  . Years of education: Not on file  . Highest education level: Not on file  Occupational History  . Not on file  Social Needs  . Financial resource strain: Not on file  . Food insecurity:    Worry: Not on file    Inability: Not on file  . Transportation needs:    Medical: Not on file    Non-medical: Not on file  Tobacco Use  . Smoking status: Former Smoker    Types: Cigarettes    Last attempt to quit: 11/02/1991    Years since quitting: 26.9  . Smokeless tobacco: Never Used  Substance and Sexual Activity  . Alcohol use: No    Frequency: Never  . Drug use: No  . Sexual activity: Not on file  Lifestyle  . Physical activity:    Days per week: Not on file    Minutes per session: Not on file  . Stress: Not on file  Relationships  . Social connections:    Talks on phone: Not on file    Gets together: Not on file    Attends religious service: Not on file    Active member of club or organization: Not on file    Attends meetings of clubs or organizations: Not on file    Relationship status: Not on file  Other Topics Concern  . Not on file  Social History Narrative  . Not on file    Tobacco Counseling Counseling given: Not Answered   Clinical Intake:     Pain : No/denies pain                  Activities of Daily Living In your present state of health, do you have any difficulty performing the following activities: 10/19/2018  Hearing? N  Vision? N  Comment wearing glasses.  Difficulty concentrating or making decisions? N  Comment likes crossword puzzles.  Walking or climbing stairs? Y  Comment gets SOB  Dressing or bathing? N  Doing errands, shopping? N  Preparing Food and eating ? N  Using the Toilet? N  In  the past six months, have you accidently leaked urine? N  Do you have problems with loss of bowel control? N  Managing your Medications? N  Managing your Finances? N  Housekeeping or managing your Housekeeping? N  Some recent data might be hidden     Immunizations and Health Maintenance Immunization History  Administered Date(s) Administered  . Influenza-Unspecified 08/01/2017, 06/07/2018  . Tdap 10/19/2018  . Zoster 08/01/2013   Health Maintenance Due  Topic Date Due  . TETANUS/TDAP  05/20/1953  . DEXA SCAN  05/19/2001  . PNA vac Low Risk Adult (1 of 2 - PCV13) 05/19/2001    Patient Care Team: Shelda Pal, DO as PCP - General (Family Medicine)  Indicate any recent Medical Services you may have received from other than [facility] providers in the past year (date may be approximate).     Assessment:   This is a routine wellness examination for Nili. Physical assessment deferred to PCP.  Hearing/Vision screen  Visual Acuity Screening   Right eye Left eye Both eyes  Without correction:     With correction: 20/25 20/25 20/25   Hearing Screening Comments: Able to hear conversational tones w/o difficulty. No issues reported.    Dietary issues and exercise activities discussed: Current Exercise Habits: The patient does not participate in regular exercise at present, Exercise limited by: None identified Diet (meal preparation, eat out, water intake, caffeinated beverages, dairy products, fruits and vegetables): 24 hour recall Breakfast: cereal Lunch: salad Dinner: chicken soup  Goals    . Increase physical activity      Depression Screen PHQ 2/9 Scores 10/19/2018  PHQ - 2 Score 0    Fall Risk Fall Risk  10/19/2018  Falls in the past year? 1  Number falls in past yr: 0  Injury with Fall? 1    Cognitive Function: MMSE - Mini Mental State Exam 10/19/2018  Orientation to time 5  Orientation to Place 5  Registration 3  Attention/ Calculation 5  Recall 1    Language- name 2 objects 2  Language- repeat 1  Language- follow 3 step command 3  Language- read & follow direction 1  Write a sentence 1  Copy design 1  Total score 28        Screening Tests Health Maintenance  Topic Date Due  . TETANUS/TDAP  05/20/1953  . DEXA SCAN  05/19/2001  . PNA vac Low Risk Adult (1 of 2 - PCV13) 05/19/2001  . INFLUENZA VACCINE  Completed     Plan:    Please schedule your next medicare wellness visit with me in 1 yr.  Continue to eat heart healthy diet (full of fruits, vegetables, whole grains, lean protein, water--limit salt, fat, and sugar intake) and increase physical activity as tolerated.  Continue doing brain stimulating activities (puzzles, reading, adult coloring books, staying active) to keep memory sharp.   Bring a copy of your living will and/or healthcare power of attorney to your next office visit.    I have personally reviewed and noted the following in the patient's chart:   . Medical and social history . Use of alcohol, tobacco or illicit drugs  . Current medications and supplements . Functional ability and status . Nutritional status . Physical activity . Advanced directives . List of other physicians . Hospitalizations, surgeries, and ER visits in previous 12 months . Vitals . Screenings to include cognitive, depression, and falls . Referrals and appointments  In addition, I have reviewed and discussed with patient certain preventive protocols, quality metrics, and best practice recommendations. A written personalized care plan for preventive services as well as general preventive health recommendations were provided to patient.     Shela Nevin, [place]   10/19/2018

## 2018-10-19 ENCOUNTER — Ambulatory Visit (INDEPENDENT_AMBULATORY_CARE_PROVIDER_SITE_OTHER): Payer: Medicare Other | Admitting: Family Medicine

## 2018-10-19 ENCOUNTER — Encounter: Payer: Self-pay | Admitting: Family Medicine

## 2018-10-19 ENCOUNTER — Encounter: Payer: Self-pay | Admitting: *Deleted

## 2018-10-19 ENCOUNTER — Ambulatory Visit (INDEPENDENT_AMBULATORY_CARE_PROVIDER_SITE_OTHER): Payer: Medicare Other | Admitting: *Deleted

## 2018-10-19 VITALS — BP 132/82 | HR 56 | Ht 65.0 in | Wt 199.0 lb

## 2018-10-19 VITALS — BP 132/82 | HR 56 | Temp 98.7°F | Ht 65.0 in | Wt 199.0 lb

## 2018-10-19 DIAGNOSIS — Z0000 Encounter for general adult medical examination without abnormal findings: Secondary | ICD-10-CM

## 2018-10-19 DIAGNOSIS — Z1239 Encounter for other screening for malignant neoplasm of breast: Secondary | ICD-10-CM | POA: Diagnosis not present

## 2018-10-19 DIAGNOSIS — E785 Hyperlipidemia, unspecified: Secondary | ICD-10-CM

## 2018-10-19 DIAGNOSIS — J42 Unspecified chronic bronchitis: Secondary | ICD-10-CM | POA: Insufficient documentation

## 2018-10-19 DIAGNOSIS — E039 Hypothyroidism, unspecified: Secondary | ICD-10-CM

## 2018-10-19 DIAGNOSIS — E2839 Other primary ovarian failure: Secondary | ICD-10-CM | POA: Diagnosis not present

## 2018-10-19 DIAGNOSIS — I10 Essential (primary) hypertension: Secondary | ICD-10-CM

## 2018-10-19 DIAGNOSIS — Z23 Encounter for immunization: Secondary | ICD-10-CM

## 2018-10-19 LAB — TSH: TSH: 1.07 u[IU]/mL (ref 0.35–4.50)

## 2018-10-19 LAB — COMPREHENSIVE METABOLIC PANEL
ALK PHOS: 89 U/L (ref 39–117)
ALT: 15 U/L (ref 0–35)
AST: 21 U/L (ref 0–37)
Albumin: 4.3 g/dL (ref 3.5–5.2)
BILIRUBIN TOTAL: 0.7 mg/dL (ref 0.2–1.2)
BUN: 17 mg/dL (ref 6–23)
CO2: 32 mEq/L (ref 19–32)
Calcium: 9.4 mg/dL (ref 8.4–10.5)
Chloride: 102 mEq/L (ref 96–112)
Creatinine, Ser: 1.12 mg/dL (ref 0.40–1.20)
GFR: 49.45 mL/min — AB (ref >60.00)
GLUCOSE: 89 mg/dL (ref 70–99)
Potassium: 5 mEq/L (ref 3.5–5.1)
SODIUM: 141 meq/L (ref 135–145)
TOTAL PROTEIN: 6.6 g/dL (ref 6.0–8.3)

## 2018-10-19 LAB — LIPID PANEL
Cholesterol: 126 mg/dL (ref 0–200)
HDL: 34.6 mg/dL — AB (ref >39.00)
LDL Cholesterol: 63 mg/dL (ref 0–99)
NONHDL: 91.14
Total CHOL/HDL Ratio: 4
Triglycerides: 140 mg/dL (ref 0.0–149.0)
VLDL: 28 mg/dL (ref 0.0–40.0)

## 2018-10-19 MED ORDER — UMECLIDINIUM BROMIDE 62.5 MCG/INH IN AEPB: 1.0000 | INHALATION_SPRAY | Freq: Every day | RESPIRATORY_TRACT | 5 refills | Status: AC

## 2018-10-19 MED ORDER — OMEPRAZOLE 20 MG PO CPDR: 20.0000 mg | DELAYED_RELEASE_CAPSULE | Freq: Two times a day (BID) | ORAL | 2 refills | Status: AC

## 2018-10-19 MED ORDER — ATORVASTATIN CALCIUM 40 MG PO TABS: 40.0000 mg | ORAL_TABLET | Freq: Every day | ORAL | 2 refills | Status: DC

## 2018-10-19 MED ORDER — METOPROLOL TARTRATE 50 MG PO TABS: 50.0000 mg | ORAL_TABLET | Freq: Two times a day (BID) | ORAL | 2 refills | Status: DC

## 2018-10-19 MED ORDER — LEVOTHYROXINE SODIUM 112 MCG PO TABS: 112.0000 ug | ORAL_TABLET | Freq: Every day | ORAL | 2 refills | Status: AC

## 2018-10-19 MED ORDER — ESCITALOPRAM OXALATE 10 MG PO TABS: 10.0000 mg | ORAL_TABLET | Freq: Every day | ORAL | 2 refills | Status: DC

## 2018-10-19 MED ORDER — AMLODIPINE BESYLATE 5 MG PO TABS: 5.0000 mg | ORAL_TABLET | Freq: Every day | ORAL | 2 refills | Status: DC

## 2018-10-19 NOTE — Patient Instructions (Addendum)
Make sure to call your insurance company regarding the cost for your mammogram. I don't think they will cover it.  Let me know if you need refills before seeing your new provider in [place].  Let me know if inhaler is too expensive.   Keep the diet clean and stay active.  Get back in the pool!   Let us know if you need anything.

## 2018-10-19 NOTE — Patient Instructions (Signed)
Please schedule your next medicare wellness visit with me in 1 yr.  Continue to eat heart healthy diet (full of fruits, vegetables, whole grains, lean protein, water--limit salt, fat, and sugar intake) and increase physical activity as tolerated.  Continue doing brain stimulating activities (puzzles, reading, adult coloring books, staying active) to keep memory sharp.   Bring a copy of your living will and/or healthcare power of attorney to your next office visit.   Dawn Bush , Thank you for taking time to come for your Medicare Wellness Visit. I appreciate your ongoing commitment to your health goals. Please review the following plan we discussed and let me know if I can assist you in the future.   These are the goals we discussed: Goals    . Increase physical activity       This is a list of the screening recommended for you and due dates:  Health Maintenance  Topic Date Due  . Tetanus Vaccine  05/20/1953  . DEXA scan (bone density measurement)  05/19/2001  . Pneumonia vaccines (1 of 2 - PCV13) 05/19/2001  . Flu Shot  Completed    Health Maintenance for Postmenopausal Women Menopause is a normal process in which your reproductive ability comes to an end. This process happens gradually over a span of months to years, usually between the ages of 42 and 33. Menopause is complete when you have missed 12 consecutive menstrual periods. It is important to talk with your health care provider about some of the most common conditions that affect postmenopausal women, such as heart disease, cancer, and bone loss (osteoporosis). Adopting a healthy lifestyle and getting preventive care can help to promote your health and wellness. Those actions can also lower your chances of developing some of these common conditions. What should I know about menopause? During menopause, you may experience a number of symptoms, such as:  Moderate-to-severe hot flashes.  Night sweats.  Decrease in sex  drive.  Mood swings.  Headaches.  Tiredness.  Irritability.  Memory problems.  Insomnia. Choosing to treat or not to treat menopausal changes is an individual decision that you make with your health care provider. What should I know about hormone replacement therapy and supplements? Hormone therapy products are effective for treating symptoms that are associated with menopause, such as hot flashes and night sweats. Hormone replacement carries certain risks, especially as you become older. If you are thinking about using estrogen or estrogen with progestin treatments, discuss the benefits and risks with your health care provider. What should I know about heart disease and stroke? Heart disease, heart attack, and stroke become more likely as you age. This may be due, in part, to the hormonal changes that your body experiences during menopause. These can affect how your body processes dietary fats, triglycerides, and cholesterol. Heart attack and stroke are both medical emergencies. There are many things that you can do to help prevent heart disease and stroke:  Have your blood pressure checked at least every 1-2 years. High blood pressure causes heart disease and increases the risk of stroke.  If you are 10-46 years old, ask your health care provider if you should take aspirin to prevent a heart attack or a stroke.  Do not use any tobacco products, including cigarettes, chewing tobacco, or electronic cigarettes. If you need help quitting, ask your health care provider.  It is important to eat a healthy diet and maintain a healthy weight. ? Be sure to include plenty of vegetables, fruits, low-fat  dairy products, and lean protein. ? Avoid eating foods that are high in solid fats, added sugars, or salt (sodium).  Get regular exercise. This is one of the most important things that you can do for your health. ? Try to exercise for at least 150 minutes each week. The type of exercise that you  do should increase your heart rate and make you sweat. This is known as moderate-intensity exercise. ? Try to do strengthening exercises at least twice each week. Do these in addition to the moderate-intensity exercise.  Know your numbers.Ask your health care provider to check your cholesterol and your blood glucose. Continue to have your blood tested as directed by your health care provider.  What should I know about cancer screening? There are several types of cancer. Take the following steps to reduce your risk and to catch any cancer development as early as possible. Breast Cancer  Practice breast self-awareness. ? This means understanding how your breasts normally appear and feel. ? It also means doing regular breast self-exams. Let your health care provider know about any changes, no matter how small.  If you are 70 or older, have a clinician do a breast exam (clinical breast exam or CBE) every year. Depending on your age, family history, and medical history, it may be recommended that you also have a yearly breast X-ray (mammogram).  If you have a family history of breast cancer, talk with your health care provider about genetic screening.  If you are at high risk for breast cancer, talk with your health care provider about having an MRI and a mammogram every year.  Breast cancer (BRCA) gene test is recommended for women who have family members with BRCA-related cancers. Results of the assessment will determine the need for genetic counseling and BRCA1 and for BRCA2 testing. BRCA-related cancers include these types: ? Breast. This occurs in males or females. ? Ovarian. ? Tubal. This may also be called fallopian tube cancer. ? Cancer of the abdominal or pelvic lining (peritoneal cancer). ? Prostate. ? Pancreatic. Cervical, Uterine, and Ovarian Cancer Your health care provider may recommend that you be screened regularly for cancer of the pelvic organs. These include your ovaries,  uterus, and vagina. This screening involves a pelvic exam, which includes checking for microscopic changes to the surface of your cervix (Pap test).  For women ages 21-65, health care providers may recommend a pelvic exam and a Pap test every three years. For women ages 67-65, they may recommend the Pap test and pelvic exam, combined with testing for human papilloma virus (HPV), every five years. Some types of HPV increase your risk of cervical cancer. Testing for HPV may also be done on women of any age who have unclear Pap test results.  Other health care providers may not recommend any screening for nonpregnant women who are considered low risk for pelvic cancer and have no symptoms. Ask your health care provider if a screening pelvic exam is right for you.  If you have had past treatment for cervical cancer or a condition that could lead to cancer, you need Pap tests and screening for cancer for at least 20 years after your treatment. If Pap tests have been discontinued for you, your risk factors (such as having a new sexual partner) need to be reassessed to determine if you should start having screenings again. Some women have medical problems that increase the chance of getting cervical cancer. In these cases, your health care provider may recommend that  you have screening and Pap tests more often.  If you have a family history of uterine cancer or ovarian cancer, talk with your health care provider about genetic screening.  If you have vaginal bleeding after reaching menopause, tell your health care provider.  There are currently no reliable tests available to screen for ovarian cancer. Lung Cancer Lung cancer screening is recommended for adults 4-82 years old who are at high risk for lung cancer because of a history of smoking. A yearly low-dose CT scan of the lungs is recommended if you:  Currently smoke.  Have a history of at least 30 pack-years of smoking and you currently smoke or have  quit within the past 15 years. A pack-year is smoking an average of one pack of cigarettes per day for one year. Yearly screening should:  Continue until it has been 15 years since you quit.  Stop if you develop a health problem that would prevent you from having lung cancer treatment. Colorectal Cancer  This type of cancer can be detected and can often be prevented.  Routine colorectal cancer screening usually begins at age 43 and continues through age 55.  If you have risk factors for colon cancer, your health care provider may recommend that you be screened at an earlier age.  If you have a family history of colorectal cancer, talk with your health care provider about genetic screening.  Your health care provider may also recommend using home test kits to check for hidden blood in your stool.  A small camera at the end of a tube can be used to examine your colon directly (sigmoidoscopy or colonoscopy). This is done to check for the earliest forms of colorectal cancer.  Direct examination of the colon should be repeated every 5-10 years until age 30. However, if early forms of precancerous polyps or small growths are found or if you have a family history or genetic risk for colorectal cancer, you may need to be screened more often. Skin Cancer  Check your skin from head to toe regularly.  Monitor any moles. Be sure to tell your health care provider: ? About any new moles or changes in moles, especially if there is a change in a mole's shape or color. ? If you have a mole that is larger than the size of a pencil eraser.  If any of your family members has a history of skin cancer, especially at a young age, talk with your health care provider about genetic screening.  Always use sunscreen. Apply sunscreen liberally and repeatedly throughout the day.  Whenever you are outside, protect yourself by wearing long sleeves, pants, a wide-brimmed hat, and sunglasses. What should I know  about osteoporosis? Osteoporosis is a condition in which bone destruction happens more quickly than new bone creation. After menopause, you may be at an increased risk for osteoporosis. To help prevent osteoporosis or the bone fractures that can happen because of osteoporosis, the following is recommended:  If you are 92-45 years old, get at least 1,000 mg of calcium and at least 600 mg of vitamin D per day.  If you are older than age 30 but younger than age 32, get at least 1,200 mg of calcium and at least 600 mg of vitamin D per day.  If you are older than age 42, get at least 1,200 mg of calcium and at least 800 mg of vitamin D per day. Smoking and excessive alcohol intake increase the risk of osteoporosis. Eat foods  that are rich in calcium and vitamin D, and do weight-bearing exercises several times each week as directed by your health care provider. What should I know about how menopause affects my mental health? Depression may occur at any age, but it is more common as you become older. Common symptoms of depression include:  Low or sad mood.  Changes in sleep patterns.  Changes in appetite or eating patterns.  Feeling an overall lack of motivation or enjoyment of activities that you previously enjoyed.  Frequent crying spells. Talk with your health care provider if you think that you are experiencing depression. What should I know about immunizations? It is important that you get and maintain your immunizations. These include:  Tetanus, diphtheria, and pertussis (Tdap) booster vaccine.  Influenza every year before the flu season begins.  Pneumonia vaccine.  Shingles vaccine. Your health care provider may also recommend other immunizations. This information is not intended to replace advice given to you by your health care provider. Make sure you discuss any questions you have with your health care provider. Document Released: 12/10/2005 Document Revised: 05/07/2016 Document  Reviewed: 07/22/2015 Elsevier Interactive Patient Education  2019 [employer].

## 2018-10-19 NOTE — Progress Notes (Signed)
Noted. Agree with above.  [place], DO 10/19/18 3:03 PM

## 2018-10-19 NOTE — Progress Notes (Signed)
Chief Complaint  Patient presents with  . Follow-up    Subjective Dawn Bush is a 84 y.o. female who presents for hypertension follow up. She does not monitor home blood pressures. She is compliant with medications- Norvasc 5 mg/d, Lopressor 50 mg bid. Patient has these side effects of medication: none She is adhering to a healthy diet overall. Current exercise: none  Hyperlipidemia Patient presents for dyslipidemia follow up. Currently being treated with Lipitor 40 mg/d and compliance with treatment thus far has been good. She denies myalgias. She is adhering to a healthy. Exercise: none The patient is not known to have coexisting coronary artery disease.  Hx of chronic bronchitis, currently on Flovent. Feels sob, wondering if it is because she is not physically active. No cough or wheezing, not using rescue inhaler.   Requesting mammogram. Has had it paid for in past, would like it ordered.  Has not had DEXA in >2 yrs.   Past Medical History:  Diagnosis Date  . AAA (abdominal aortic aneurysm) ([place])   . Allergy   . Arthritis   . Chronic bronchitis ([place])   . Colon polyps   . GERD (gastroesophageal reflux disease)   . Headache   . History of blood transfusion   . Hyperlipidemia   . Hypertension   . Thyroid disease     Review of Systems Cardiovascular: no chest pain Respiratory:  +shortness of breath as noted above  Exam BP 132/82 (BP Location: Left Arm, Patient Position: Sitting, Cuff Size: Normal)   Pulse (!) 56   Temp 98.7 F (37.1 C) (Oral)   Ht 5\' 5"  (1.651 m)   Wt 199 lb (90.3 kg)   SpO2 93%   BMI 33.12 kg/m  General:  well developed, well nourished, in no apparent distress Heart: RRR, no bruits, no LE edema Lungs: clear to auscultation, no accessory muscle use Psych: well oriented with normal range of affect and appropriate judgment/insight  Essential hypertension - Plan: amLODipine (NORVASC) 5 MG tablet, metoprolol tartrate (LOPRESSOR) 50 MG  tablet, Comprehensive metabolic panel  Hyperlipidemia, unspecified hyperlipidemia type - Plan: atorvastatin (LIPITOR) 40 MG tablet, TSH, Lipid panel, Comprehensive metabolic panel  Hypothyroidism, unspecified type - Plan: levothyroxine (SYNTHROID, LEVOTHROID) 112 MCG tablet, TSH  Screening for breast cancer - Plan: MM 3D SCREEN BREAST BILATERAL  Chronic bronchitis, unspecified chronic bronchitis type ([place]) - Plan: umeclidinium bromide (INCRUSE ELLIPTA) 62.5 MCG/INH AEPB  Estrogen deficiency - Plan: DG Bone Density  Orders as above. Counseled on diet and exercise. She was told multiple times to ck w ins prior to getting mammogram. Waiver printed.  Stop Flovent, go on LAMA.  F/u in 6 mo- moving so we may not see her again. The patient voiced understanding and agreement to the plan.  [place], DO 10/19/18  2:01 PM

## 2018-10-19 NOTE — Progress Notes (Signed)
Pre visit review using our clinic review tool, if applicable. No additional management support is needed unless otherwise documented below in the visit note. 

## 2018-10-19 NOTE — Addendum Note (Signed)
Addended by: Sharon Seller B on: 10/19/2018 02:10 PM   Modules accepted: Orders

## 2018-10-24 ENCOUNTER — Other Ambulatory Visit: Payer: Self-pay | Admitting: Family Medicine

## 2018-10-26 ENCOUNTER — Encounter: Payer: Self-pay | Admitting: Family Medicine

## 2018-10-26 ENCOUNTER — Ambulatory Visit (HOSPITAL_BASED_OUTPATIENT_CLINIC_OR_DEPARTMENT_OTHER)
Admission: RE | Admit: 2018-10-26 | Discharge: 2018-10-26 | Disposition: A | Discharge status: Home / Self Care | Payer: Medicare Other | Source: Ambulatory Visit | Attending: Family Medicine | Admitting: Family Medicine

## 2018-10-26 DIAGNOSIS — Z1231 Encounter for screening mammogram for malignant neoplasm of breast: Secondary | ICD-10-CM | POA: Diagnosis not present

## 2018-10-26 DIAGNOSIS — Z780 Asymptomatic menopausal state: Secondary | ICD-10-CM | POA: Diagnosis not present

## 2018-10-26 DIAGNOSIS — Z1239 Encounter for other screening for malignant neoplasm of breast: Secondary | ICD-10-CM

## 2018-10-26 DIAGNOSIS — E2839 Other primary ovarian failure: Secondary | ICD-10-CM | POA: Diagnosis not present

## 2018-10-26 DIAGNOSIS — M85851 Other specified disorders of bone density and structure, right thigh: Secondary | ICD-10-CM | POA: Diagnosis not present

## 2018-10-26 DIAGNOSIS — M810 Age-related osteoporosis without current pathological fracture: Secondary | ICD-10-CM | POA: Diagnosis not present

## 2018-10-26 DIAGNOSIS — M85852 Other specified disorders of bone density and structure, left thigh: Secondary | ICD-10-CM | POA: Diagnosis not present

## 2018-10-27 ENCOUNTER — Other Ambulatory Visit: Payer: Self-pay | Admitting: Family Medicine

## 2018-10-27 MED ORDER — ALENDRONATE SODIUM 70 MG PO TABS: 70.0000 mg | ORAL_TABLET | ORAL | 3 refills | Status: AC

## 2018-11-20 ENCOUNTER — Ambulatory Visit (INDEPENDENT_AMBULATORY_CARE_PROVIDER_SITE_OTHER): Payer: Medicare Other | Admitting: Family Medicine

## 2018-11-20 ENCOUNTER — Encounter: Payer: Self-pay | Admitting: Family Medicine

## 2018-11-20 VITALS — BP 126/72 | HR 77 | Temp 98.4°F | Resp 20 | Wt 197.4 lb

## 2018-11-20 DIAGNOSIS — J069 Acute upper respiratory infection, unspecified: Secondary | ICD-10-CM | POA: Diagnosis not present

## 2018-11-20 MED ORDER — PREDNISONE 20 MG PO TABS: 40.0000 mg | ORAL_TABLET | Freq: Every day | ORAL | 0 refills | Status: AC

## 2018-11-20 MED ORDER — METHYLPREDNISOLONE ACETATE 80 MG/ML IJ SUSP
80.0000 mg | Freq: Once | INTRAMUSCULAR | Status: AC
  Administered 2018-11-20: 80 mg via INTRAMUSCULAR

## 2018-11-20 NOTE — Progress Notes (Signed)
CC: [name]  Dawn Bush here for [name] complaints.  Duration: 1 week  Associated symptoms: sinus congestion, cough, and shortness of breath Denies: sinus congestion, sinus pain, rhinorrhea, ear fullness, ear pain, ear drainage, sore throat, wheezing, myalgia and fever Treatment to date: Mucinex Sick contacts: Yes- son  ROS:  Const: Denies fevers HEENT: As noted in HPI Lungs: +cough  Past Medical History:  Diagnosis Date  . AAA (abdominal aortic aneurysm) ([place])   . Allergy   . Arthritis   . Chronic bronchitis ([place])   . Colon polyps   . GERD (gastroesophageal reflux disease)   . Headache   . History of blood transfusion   . Hyperlipidemia   . Hypertension   . Osteoporosis   . Thyroid disease     BP 126/72 (BP Location: Left Arm, Patient Position: Sitting, Cuff Size: Normal)   Pulse 77   Temp 98.4 F (36.9 C) (Oral)   Resp 20   Wt 197 lb 6.4 oz (89.5 kg)   SpO2 94%   BMI 32.85 kg/m  General: Awake, alert, appears stated age HEENT: AT, [place], ears patent b/l and TM's neg, nares patent w/o discharge, pharynx pink and without exudates, MMM Neck: No masses or asymmetry Heart: RRR Lungs: clear, though not moving much air, no accessory muscle use Psych: Age appropriate judgment and insight, normal mood and affect  [name] with cough and congestion - Plan: predniSONE (DELTASONE) 20 MG tablet  Orders as above. 40 mg/d pred starting tomorrow, IM depo today.  Continue to push fluids, practice good hand hygiene, cover mouth when coughing. F/u prn. If starting to experience fevers, shaking, or shortness of breath, seek immediate care. Pt voiced understanding and agreement to the plan.  [place], DO 11/20/18 3:22 PM

## 2018-11-20 NOTE — Addendum Note (Signed)
Addended by: Magdalene Molly A on: 11/20/2018 04:02 PM   Modules accepted: Orders

## 2018-11-20 NOTE — Patient Instructions (Signed)
Continue to push fluids, practice good hand hygiene, and cover your mouth if you cough.  If you start having fevers, shaking or shortness of breath, seek immediate care.  Continue Mucinex.  For symptoms, consider using Vick's VapoRub on chest or under nose, air humidifier, Benadryl at night, and elevating the head of the bed. Tylenol and ibuprofen for aches and pains you may be experiencing.   Let us know if you need anything.

## 2019-01-26 ENCOUNTER — Other Ambulatory Visit: Payer: Self-pay | Admitting: Family Medicine

## 2019-01-26 DIAGNOSIS — R062 Wheezing: Secondary | ICD-10-CM

## 2019-01-26 DIAGNOSIS — J302 Other seasonal allergic rhinitis: Secondary | ICD-10-CM

## 2019-03-13 ENCOUNTER — Other Ambulatory Visit: Payer: Self-pay | Admitting: Family Medicine

## 2019-03-13 MED ORDER — MELOXICAM 7.5 MG PO TABS: 7.5000 mg | ORAL_TABLET | Freq: Every day | ORAL | 0 refills | Status: DC

## 2019-03-26 DIAGNOSIS — L0390 Cellulitis, unspecified: Secondary | ICD-10-CM | POA: Diagnosis not present

## 2019-03-26 DIAGNOSIS — M25532 Pain in left wrist: Secondary | ICD-10-CM | POA: Diagnosis not present

## 2019-03-26 DIAGNOSIS — S60212A Contusion of left wrist, initial encounter: Secondary | ICD-10-CM | POA: Diagnosis not present

## 2019-03-26 DIAGNOSIS — Z6837 Body mass index (BMI) 37.0-37.9, adult: Secondary | ICD-10-CM | POA: Diagnosis not present

## 2019-03-28 DIAGNOSIS — I10 Essential (primary) hypertension: Secondary | ICD-10-CM | POA: Diagnosis not present

## 2019-03-28 DIAGNOSIS — E7800 Pure hypercholesterolemia, unspecified: Secondary | ICD-10-CM | POA: Diagnosis not present

## 2019-03-28 DIAGNOSIS — M1990 Unspecified osteoarthritis, unspecified site: Secondary | ICD-10-CM | POA: Diagnosis not present

## 2019-04-02 DIAGNOSIS — Z96611 Presence of right artificial shoulder joint: Secondary | ICD-10-CM | POA: Diagnosis not present

## 2019-04-02 DIAGNOSIS — M5030 Other cervical disc degeneration, unspecified cervical region: Secondary | ICD-10-CM | POA: Diagnosis not present

## 2019-04-03 DIAGNOSIS — I714 Abdominal aortic aneurysm, without rupture: Secondary | ICD-10-CM | POA: Diagnosis not present

## 2019-04-03 DIAGNOSIS — I10 Essential (primary) hypertension: Secondary | ICD-10-CM | POA: Diagnosis not present

## 2019-04-03 DIAGNOSIS — I493 Ventricular premature depolarization: Secondary | ICD-10-CM | POA: Diagnosis not present

## 2019-04-03 DIAGNOSIS — J449 Chronic obstructive pulmonary disease, unspecified: Secondary | ICD-10-CM | POA: Diagnosis not present

## 2019-04-03 DIAGNOSIS — F329 Major depressive disorder, single episode, unspecified: Secondary | ICD-10-CM | POA: Diagnosis not present

## 2019-04-16 ENCOUNTER — Ambulatory Visit: Payer: Medicare Other | Admitting: Family Medicine

## 2019-04-16 DIAGNOSIS — M5030 Other cervical disc degeneration, unspecified cervical region: Secondary | ICD-10-CM | POA: Diagnosis not present

## 2019-04-16 DIAGNOSIS — M25511 Pain in right shoulder: Secondary | ICD-10-CM | POA: Diagnosis not present

## 2019-05-01 DIAGNOSIS — M5030 Other cervical disc degeneration, unspecified cervical region: Secondary | ICD-10-CM | POA: Diagnosis not present

## 2019-05-01 DIAGNOSIS — M25511 Pain in right shoulder: Secondary | ICD-10-CM | POA: Diagnosis not present

## 2019-05-08 DIAGNOSIS — M5030 Other cervical disc degeneration, unspecified cervical region: Secondary | ICD-10-CM | POA: Diagnosis not present

## 2019-05-08 DIAGNOSIS — M25511 Pain in right shoulder: Secondary | ICD-10-CM | POA: Diagnosis not present

## 2019-05-23 DIAGNOSIS — M25511 Pain in right shoulder: Secondary | ICD-10-CM | POA: Diagnosis not present

## 2019-05-23 DIAGNOSIS — M5030 Other cervical disc degeneration, unspecified cervical region: Secondary | ICD-10-CM | POA: Diagnosis not present

## 2019-05-30 DIAGNOSIS — M5030 Other cervical disc degeneration, unspecified cervical region: Secondary | ICD-10-CM | POA: Diagnosis not present

## 2019-05-30 DIAGNOSIS — M25511 Pain in right shoulder: Secondary | ICD-10-CM | POA: Diagnosis not present

## 2019-06-02 ENCOUNTER — Other Ambulatory Visit: Payer: Self-pay | Admitting: Family Medicine

## 2019-06-02 DIAGNOSIS — I10 Essential (primary) hypertension: Secondary | ICD-10-CM

## 2019-07-11 ENCOUNTER — Other Ambulatory Visit: Payer: Self-pay | Admitting: Family Medicine

## 2019-08-13 DIAGNOSIS — H353112 Nonexudative age-related macular degeneration, right eye, intermediate dry stage: Secondary | ICD-10-CM | POA: Diagnosis not present

## 2019-08-13 DIAGNOSIS — Z961 Presence of intraocular lens: Secondary | ICD-10-CM | POA: Diagnosis not present

## 2019-08-13 DIAGNOSIS — H524 Presbyopia: Secondary | ICD-10-CM | POA: Diagnosis not present

## 2019-08-14 DIAGNOSIS — Z6835 Body mass index (BMI) 35.0-35.9, adult: Secondary | ICD-10-CM | POA: Diagnosis not present

## 2019-08-14 DIAGNOSIS — H43813 Vitreous degeneration, bilateral: Secondary | ICD-10-CM | POA: Diagnosis not present

## 2019-08-14 DIAGNOSIS — H3561 Retinal hemorrhage, right eye: Secondary | ICD-10-CM | POA: Diagnosis not present

## 2019-08-14 DIAGNOSIS — Q141 Congenital malformation of retina: Secondary | ICD-10-CM | POA: Diagnosis not present

## 2019-08-14 DIAGNOSIS — H353211 Exudative age-related macular degeneration, right eye, with active choroidal neovascularization: Secondary | ICD-10-CM | POA: Diagnosis not present

## 2019-08-14 DIAGNOSIS — H35412 Lattice degeneration of retina, left eye: Secondary | ICD-10-CM | POA: Diagnosis not present

## 2019-08-14 DIAGNOSIS — H353122 Nonexudative age-related macular degeneration, left eye, intermediate dry stage: Secondary | ICD-10-CM | POA: Diagnosis not present

## 2019-08-14 DIAGNOSIS — M1611 Unilateral primary osteoarthritis, right hip: Secondary | ICD-10-CM | POA: Diagnosis not present

## 2019-08-14 DIAGNOSIS — M533 Sacrococcygeal disorders, not elsewhere classified: Secondary | ICD-10-CM | POA: Diagnosis not present

## 2019-08-14 DIAGNOSIS — H43393 Other vitreous opacities, bilateral: Secondary | ICD-10-CM | POA: Diagnosis not present

## 2019-08-31 DIAGNOSIS — I70209 Unspecified atherosclerosis of native arteries of extremities, unspecified extremity: Secondary | ICD-10-CM | POA: Diagnosis not present

## 2019-08-31 DIAGNOSIS — I714 Abdominal aortic aneurysm, without rupture: Secondary | ICD-10-CM | POA: Diagnosis not present

## 2019-09-11 DIAGNOSIS — H353211 Exudative age-related macular degeneration, right eye, with active choroidal neovascularization: Secondary | ICD-10-CM | POA: Diagnosis not present

## 2019-09-29 IMAGING — MG DIGITAL SCREENING BILATERAL MAMMOGRAM WITH TOMO AND CAD
8 series · 8 of 24 positions shown · non-contrast
Comparison: None.

ACR Breast Density Category a: The breast tissue is almost entirely
fatty.

CLINICAL DATA: Screening.

EXAM:
DIGITAL SCREENING BILATERAL MAMMOGRAM WITH TOMO AND CAD

[L MLO synth-2D]
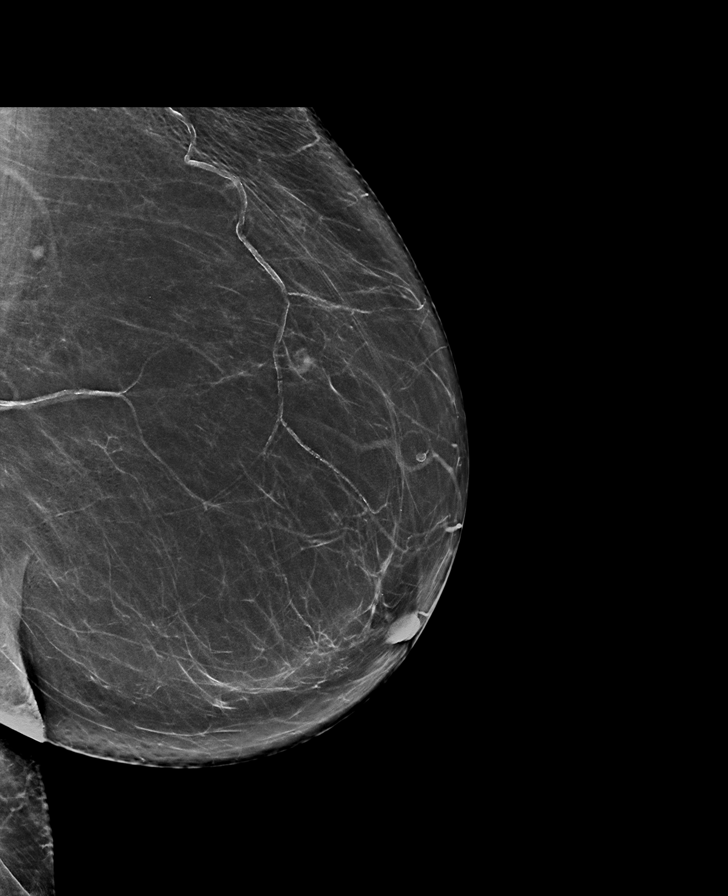

[R CC synth-2D]
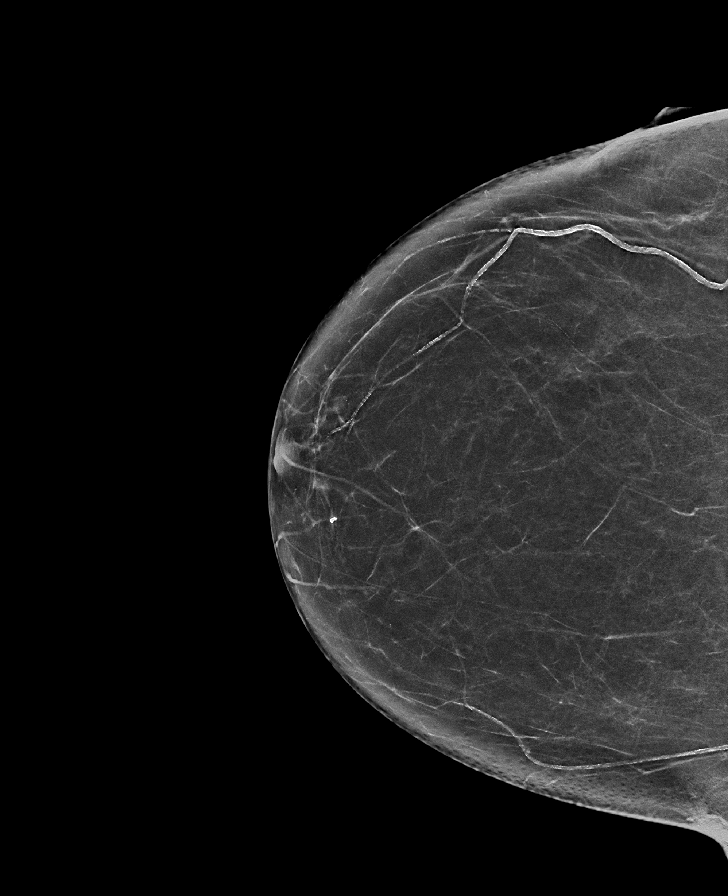

[R MLO synth-2D]
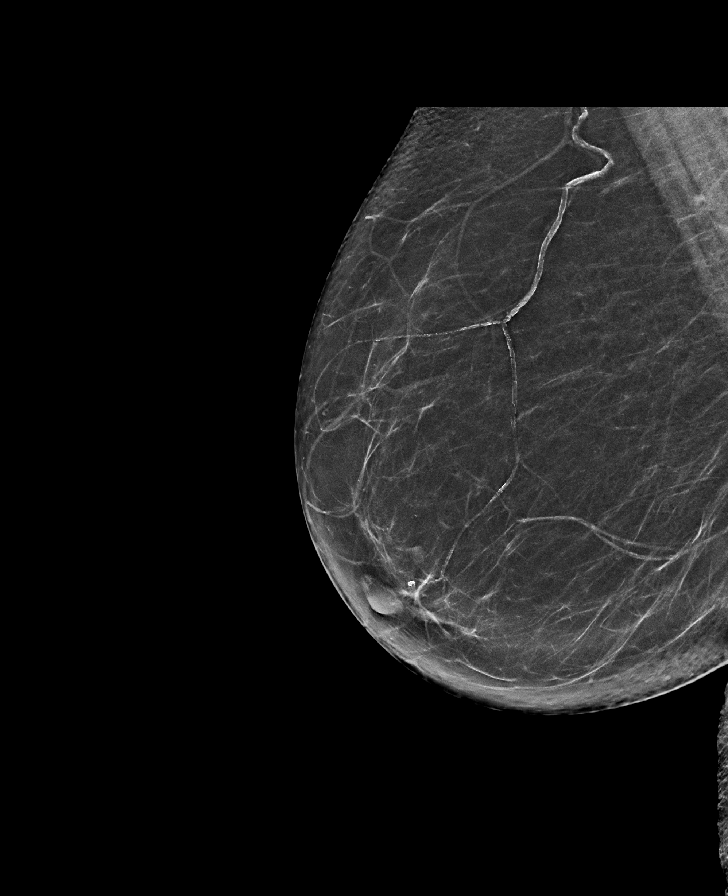

[L CC synth-2D]
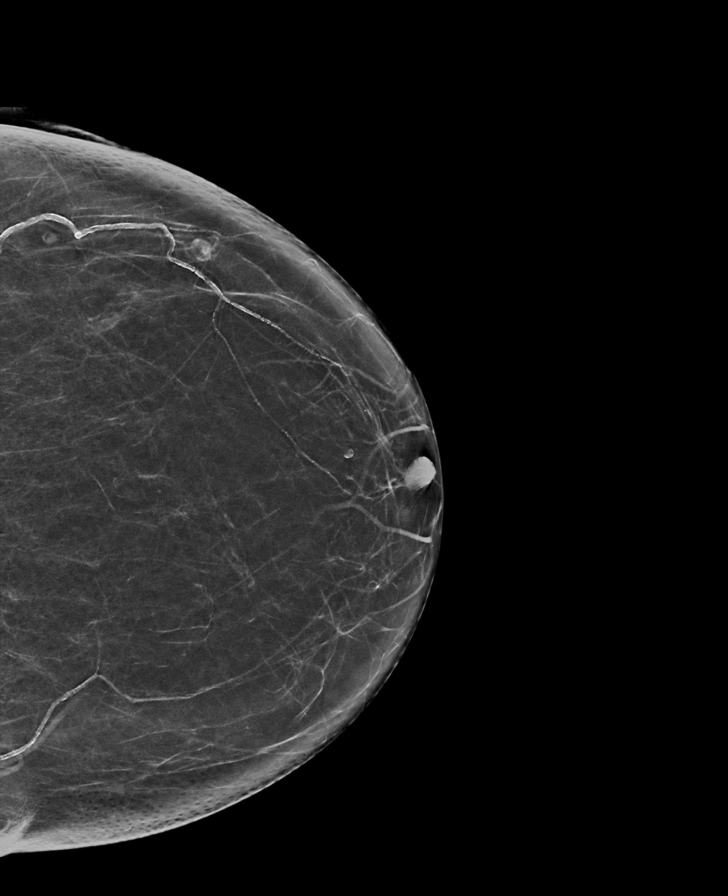

[R CC tomo · tomo slice 35/69.0]
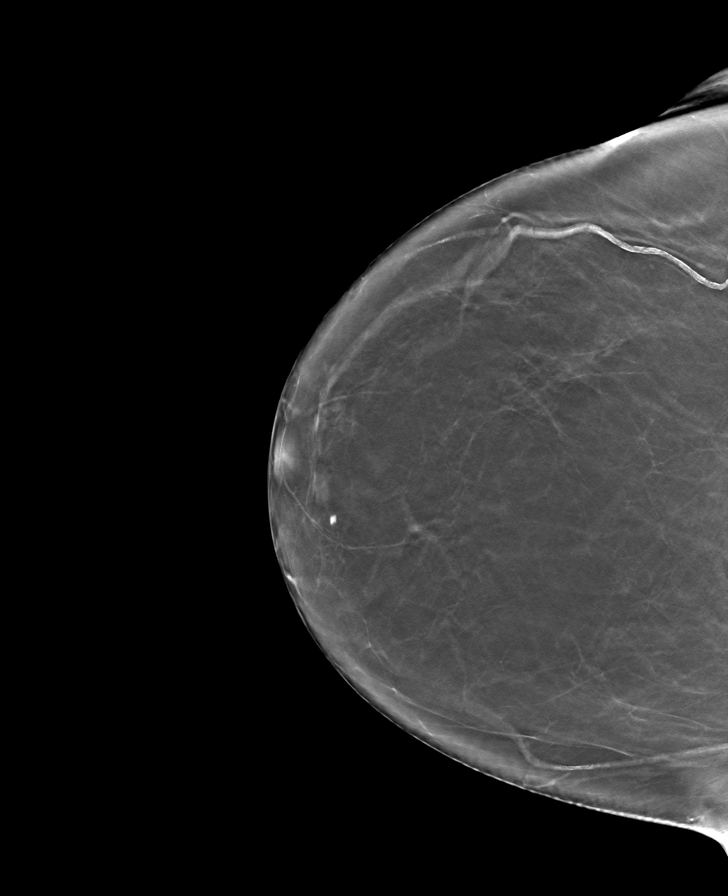

[L CC tomo · tomo slice 35/69.0]
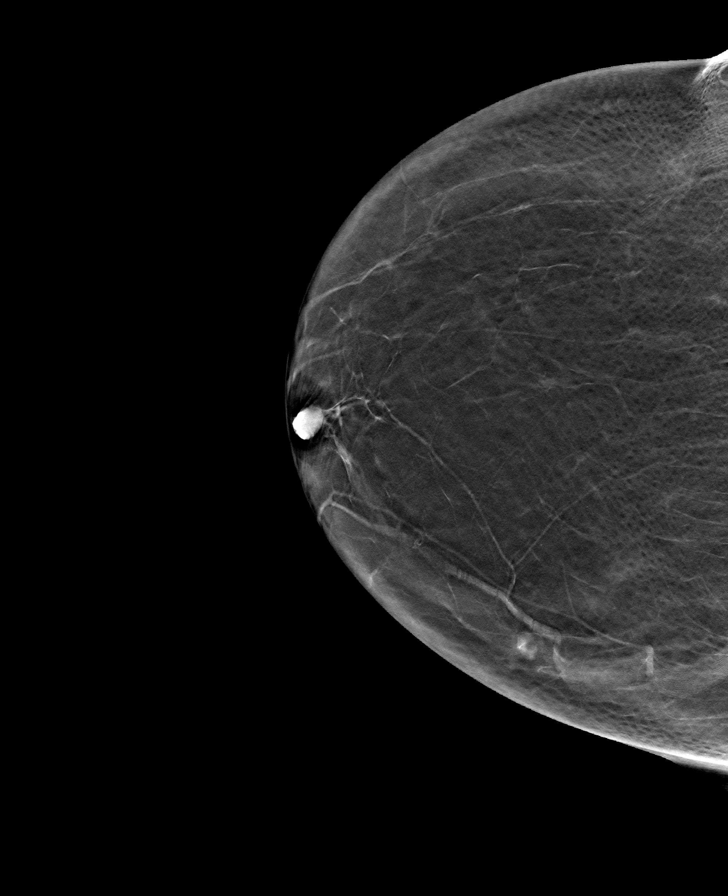

[L MLO tomo · tomo slice 37/74.0]
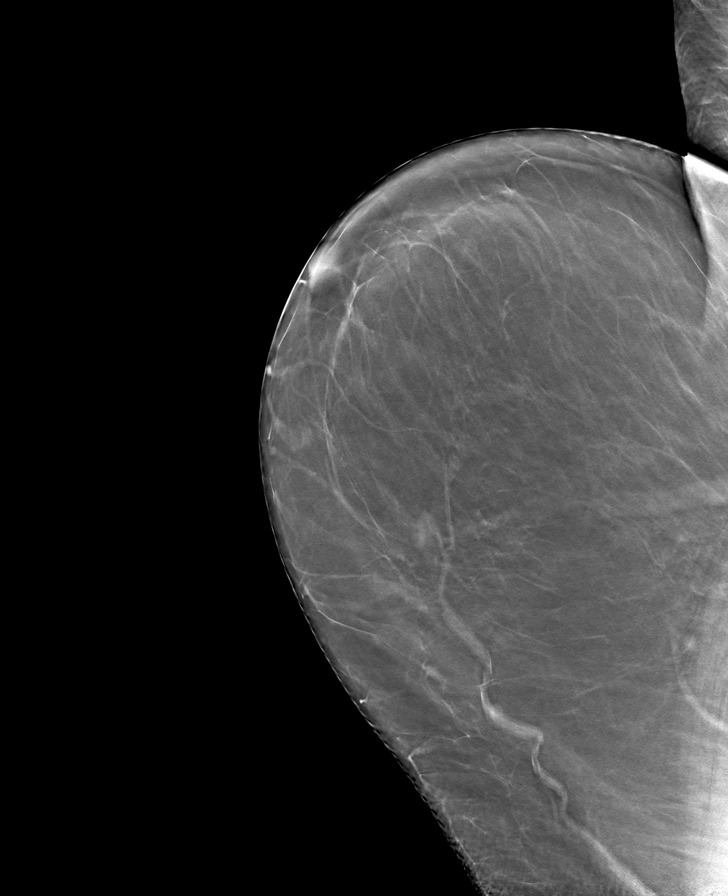

[R MLO tomo · tomo slice 36/71.0]
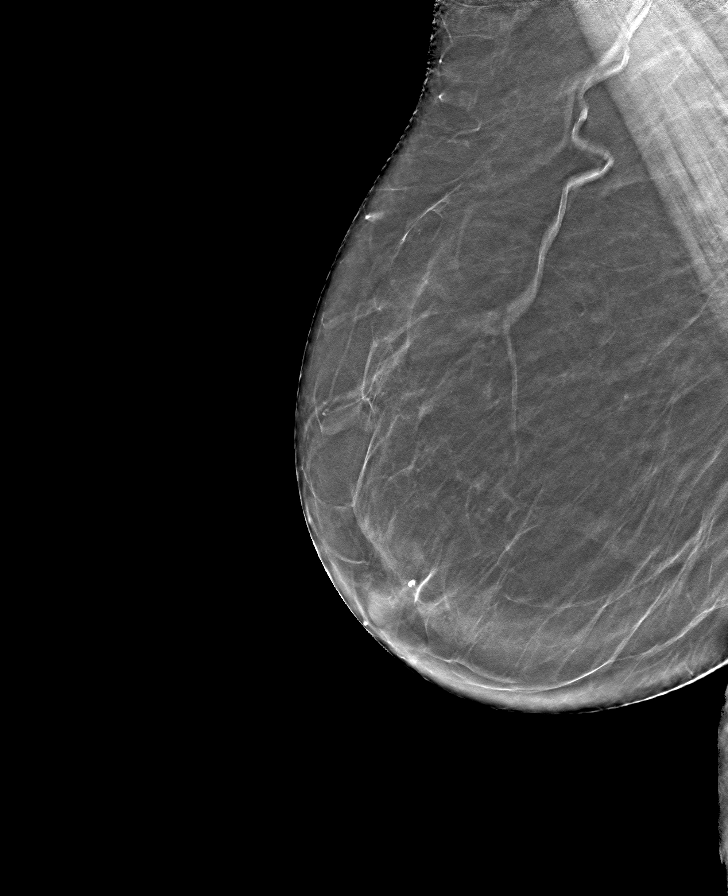

[8 of 24 positions shown; findings below may reference images not displayed]

FINDINGS: There are no findings suspicious for malignancy. Images were
processed with CAD.
IMPRESSION: No mammographic evidence of malignancy. A result letter of this
screening mammogram will be mailed directly to the patient.

RECOMMENDATION:
Screening mammogram in one year. (Code:[id number])

BI-RADS CATEGORY  1: Negative.

## 2019-10-03 DIAGNOSIS — I10 Essential (primary) hypertension: Secondary | ICD-10-CM | POA: Diagnosis not present

## 2019-10-03 DIAGNOSIS — F329 Major depressive disorder, single episode, unspecified: Secondary | ICD-10-CM | POA: Diagnosis not present

## 2019-10-03 DIAGNOSIS — E039 Hypothyroidism, unspecified: Secondary | ICD-10-CM | POA: Diagnosis not present

## 2019-10-03 DIAGNOSIS — E7800 Pure hypercholesterolemia, unspecified: Secondary | ICD-10-CM | POA: Diagnosis not present

## 2019-10-03 DIAGNOSIS — Z0000 Encounter for general adult medical examination without abnormal findings: Secondary | ICD-10-CM | POA: Diagnosis not present

## 2019-10-03 DIAGNOSIS — I723 Aneurysm of iliac artery: Secondary | ICD-10-CM | POA: Diagnosis not present

## 2019-10-20 ENCOUNTER — Other Ambulatory Visit: Payer: Self-pay | Admitting: Family Medicine

## 2019-10-20 DIAGNOSIS — E785 Hyperlipidemia, unspecified: Secondary | ICD-10-CM

## 2019-10-20 DIAGNOSIS — I10 Essential (primary) hypertension: Secondary | ICD-10-CM

## 2019-10-21 ENCOUNTER — Other Ambulatory Visit: Payer: Self-pay | Admitting: Family Medicine

## 2019-10-21 DIAGNOSIS — I10 Essential (primary) hypertension: Secondary | ICD-10-CM

## 2019-12-13 DIAGNOSIS — M542 Cervicalgia: Secondary | ICD-10-CM | POA: Diagnosis not present

## 2019-12-13 DIAGNOSIS — Z96611 Presence of right artificial shoulder joint: Secondary | ICD-10-CM | POA: Diagnosis not present

## 2019-12-13 DIAGNOSIS — M5030 Other cervical disc degeneration, unspecified cervical region: Secondary | ICD-10-CM | POA: Diagnosis not present

## 2019-12-19 DIAGNOSIS — H353211 Exudative age-related macular degeneration, right eye, with active choroidal neovascularization: Secondary | ICD-10-CM | POA: Diagnosis not present

## 2019-12-19 DIAGNOSIS — L309 Dermatitis, unspecified: Secondary | ICD-10-CM | POA: Diagnosis not present

## 2019-12-19 DIAGNOSIS — R0602 Shortness of breath: Secondary | ICD-10-CM | POA: Diagnosis not present

## 2019-12-19 DIAGNOSIS — K219 Gastro-esophageal reflux disease without esophagitis: Secondary | ICD-10-CM | POA: Diagnosis not present

## 2019-12-26 DIAGNOSIS — M4692 Unspecified inflammatory spondylopathy, cervical region: Secondary | ICD-10-CM | POA: Diagnosis not present

## 2019-12-26 DIAGNOSIS — M4802 Spinal stenosis, cervical region: Secondary | ICD-10-CM | POA: Diagnosis not present

## 2020-01-03 DIAGNOSIS — M5030 Other cervical disc degeneration, unspecified cervical region: Secondary | ICD-10-CM | POA: Diagnosis not present

## 2020-01-03 DIAGNOSIS — M5412 Radiculopathy, cervical region: Secondary | ICD-10-CM | POA: Diagnosis not present

## 2020-01-14 ENCOUNTER — Other Ambulatory Visit: Payer: Self-pay | Admitting: Family Medicine

## 2020-01-29 DIAGNOSIS — M5412 Radiculopathy, cervical region: Secondary | ICD-10-CM | POA: Diagnosis not present

## 2020-01-29 DIAGNOSIS — M5010 Cervical disc disorder with radiculopathy, unspecified cervical region: Secondary | ICD-10-CM | POA: Diagnosis not present

## 2020-01-29 DIAGNOSIS — M542 Cervicalgia: Secondary | ICD-10-CM | POA: Diagnosis not present

## 2020-02-06 DIAGNOSIS — M85851 Other specified disorders of bone density and structure, right thigh: Secondary | ICD-10-CM | POA: Diagnosis not present

## 2020-02-06 DIAGNOSIS — Z1231 Encounter for screening mammogram for malignant neoplasm of breast: Secondary | ICD-10-CM | POA: Diagnosis not present

## 2020-02-12 DIAGNOSIS — M5412 Radiculopathy, cervical region: Secondary | ICD-10-CM | POA: Diagnosis not present

## 2020-02-13 DIAGNOSIS — H3561 Retinal hemorrhage, right eye: Secondary | ICD-10-CM | POA: Diagnosis not present

## 2020-02-13 DIAGNOSIS — H43393 Other vitreous opacities, bilateral: Secondary | ICD-10-CM | POA: Diagnosis not present

## 2020-02-13 DIAGNOSIS — H35412 Lattice degeneration of retina, left eye: Secondary | ICD-10-CM | POA: Diagnosis not present

## 2020-02-13 DIAGNOSIS — H353211 Exudative age-related macular degeneration, right eye, with active choroidal neovascularization: Secondary | ICD-10-CM | POA: Diagnosis not present

## 2020-02-13 DIAGNOSIS — H353122 Nonexudative age-related macular degeneration, left eye, intermediate dry stage: Secondary | ICD-10-CM | POA: Diagnosis not present

## 2020-04-03 DIAGNOSIS — E039 Hypothyroidism, unspecified: Secondary | ICD-10-CM | POA: Diagnosis not present

## 2020-04-10 DIAGNOSIS — I493 Ventricular premature depolarization: Secondary | ICD-10-CM | POA: Diagnosis not present

## 2020-04-10 DIAGNOSIS — I714 Abdominal aortic aneurysm, without rupture: Secondary | ICD-10-CM | POA: Diagnosis not present

## 2020-04-10 DIAGNOSIS — E039 Hypothyroidism, unspecified: Secondary | ICD-10-CM | POA: Diagnosis not present

## 2020-04-10 DIAGNOSIS — M1990 Unspecified osteoarthritis, unspecified site: Secondary | ICD-10-CM | POA: Diagnosis not present

## 2020-04-10 DIAGNOSIS — I10 Essential (primary) hypertension: Secondary | ICD-10-CM | POA: Diagnosis not present

## 2020-04-10 DIAGNOSIS — J449 Chronic obstructive pulmonary disease, unspecified: Secondary | ICD-10-CM | POA: Diagnosis not present

## 2020-06-18 DIAGNOSIS — H35412 Lattice degeneration of retina, left eye: Secondary | ICD-10-CM | POA: Diagnosis not present

## 2020-06-18 DIAGNOSIS — Q141 Congenital malformation of retina: Secondary | ICD-10-CM | POA: Diagnosis not present

## 2020-06-18 DIAGNOSIS — H43393 Other vitreous opacities, bilateral: Secondary | ICD-10-CM | POA: Diagnosis not present

## 2020-06-18 DIAGNOSIS — H353211 Exudative age-related macular degeneration, right eye, with active choroidal neovascularization: Secondary | ICD-10-CM | POA: Diagnosis not present

## 2020-06-18 DIAGNOSIS — H3561 Retinal hemorrhage, right eye: Secondary | ICD-10-CM | POA: Diagnosis not present

## 2020-06-18 DIAGNOSIS — H353122 Nonexudative age-related macular degeneration, left eye, intermediate dry stage: Secondary | ICD-10-CM | POA: Diagnosis not present

## 2020-06-20 DIAGNOSIS — R42 Dizziness and giddiness: Secondary | ICD-10-CM | POA: Diagnosis not present

## 2020-07-10 DIAGNOSIS — I10 Essential (primary) hypertension: Secondary | ICD-10-CM | POA: Diagnosis not present

## 2020-07-10 DIAGNOSIS — E7800 Pure hypercholesterolemia, unspecified: Secondary | ICD-10-CM | POA: Diagnosis not present

## 2020-07-10 DIAGNOSIS — E039 Hypothyroidism, unspecified: Secondary | ICD-10-CM | POA: Diagnosis not present

## 2020-07-10 DIAGNOSIS — I723 Aneurysm of iliac artery: Secondary | ICD-10-CM | POA: Diagnosis not present

## 2020-07-10 DIAGNOSIS — R42 Dizziness and giddiness: Secondary | ICD-10-CM | POA: Diagnosis not present

## 2020-07-10 DIAGNOSIS — I493 Ventricular premature depolarization: Secondary | ICD-10-CM | POA: Diagnosis not present

## 2020-07-29 DIAGNOSIS — M5412 Radiculopathy, cervical region: Secondary | ICD-10-CM | POA: Diagnosis not present

## 2020-07-29 DIAGNOSIS — Z79899 Other long term (current) drug therapy: Secondary | ICD-10-CM | POA: Diagnosis not present

## 2020-07-29 DIAGNOSIS — M542 Cervicalgia: Secondary | ICD-10-CM | POA: Diagnosis not present

## 2020-08-05 DIAGNOSIS — R42 Dizziness and giddiness: Secondary | ICD-10-CM | POA: Diagnosis not present

## 2020-08-08 DIAGNOSIS — Z23 Encounter for immunization: Secondary | ICD-10-CM | POA: Diagnosis not present

## 2020-08-14 DIAGNOSIS — M5412 Radiculopathy, cervical region: Secondary | ICD-10-CM | POA: Diagnosis not present

## 2020-08-17 DIAGNOSIS — Z23 Encounter for immunization: Secondary | ICD-10-CM | POA: Diagnosis not present

## 2020-08-20 DIAGNOSIS — Z20828 Contact with and (suspected) exposure to other viral communicable diseases: Secondary | ICD-10-CM | POA: Diagnosis not present

## 2020-09-10 DIAGNOSIS — H353231 Exudative age-related macular degeneration, bilateral, with active choroidal neovascularization: Secondary | ICD-10-CM | POA: Diagnosis not present

## 2020-09-10 DIAGNOSIS — H35412 Lattice degeneration of retina, left eye: Secondary | ICD-10-CM | POA: Diagnosis not present

## 2020-09-10 DIAGNOSIS — H3562 Retinal hemorrhage, left eye: Secondary | ICD-10-CM | POA: Diagnosis not present

## 2020-09-10 DIAGNOSIS — Q141 Congenital malformation of retina: Secondary | ICD-10-CM | POA: Diagnosis not present

## 2020-09-11 DIAGNOSIS — M5412 Radiculopathy, cervical region: Secondary | ICD-10-CM | POA: Diagnosis not present

## 2020-09-11 DIAGNOSIS — Z79899 Other long term (current) drug therapy: Secondary | ICD-10-CM | POA: Diagnosis not present

## 2020-09-11 DIAGNOSIS — M542 Cervicalgia: Secondary | ICD-10-CM | POA: Diagnosis not present

## 2020-09-15 DIAGNOSIS — R208 Other disturbances of skin sensation: Secondary | ICD-10-CM | POA: Diagnosis not present

## 2020-09-15 DIAGNOSIS — L538 Other specified erythematous conditions: Secondary | ICD-10-CM | POA: Diagnosis not present

## 2020-09-15 DIAGNOSIS — D1801 Hemangioma of skin and subcutaneous tissue: Secondary | ICD-10-CM | POA: Diagnosis not present

## 2020-09-15 DIAGNOSIS — L281 Prurigo nodularis: Secondary | ICD-10-CM | POA: Diagnosis not present

## 2020-09-15 DIAGNOSIS — L820 Inflamed seborrheic keratosis: Secondary | ICD-10-CM | POA: Diagnosis not present

## 2020-09-15 DIAGNOSIS — L821 Other seborrheic keratosis: Secondary | ICD-10-CM | POA: Diagnosis not present

## 2020-09-15 DIAGNOSIS — L570 Actinic keratosis: Secondary | ICD-10-CM | POA: Diagnosis not present

## 2020-09-15 DIAGNOSIS — L853 Xerosis cutis: Secondary | ICD-10-CM | POA: Diagnosis not present

## 2020-09-15 DIAGNOSIS — L298 Other pruritus: Secondary | ICD-10-CM | POA: Diagnosis not present

## 2020-09-17 DIAGNOSIS — I724 Aneurysm of artery of lower extremity: Secondary | ICD-10-CM | POA: Diagnosis not present

## 2020-09-17 DIAGNOSIS — I723 Aneurysm of iliac artery: Secondary | ICD-10-CM | POA: Diagnosis not present

## 2020-09-17 DIAGNOSIS — I70209 Unspecified atherosclerosis of native arteries of extremities, unspecified extremity: Secondary | ICD-10-CM | POA: Diagnosis not present

## 2020-09-17 DIAGNOSIS — I712 Thoracic aortic aneurysm, without rupture: Secondary | ICD-10-CM | POA: Diagnosis not present

## 2020-09-17 DIAGNOSIS — I714 Abdominal aortic aneurysm, without rupture: Secondary | ICD-10-CM | POA: Diagnosis not present

## 2020-10-08 DIAGNOSIS — H3562 Retinal hemorrhage, left eye: Secondary | ICD-10-CM | POA: Diagnosis not present

## 2020-10-08 DIAGNOSIS — H353221 Exudative age-related macular degeneration, left eye, with active choroidal neovascularization: Secondary | ICD-10-CM | POA: Diagnosis not present

## 2020-10-14 DIAGNOSIS — J449 Chronic obstructive pulmonary disease, unspecified: Secondary | ICD-10-CM | POA: Diagnosis not present

## 2020-10-14 DIAGNOSIS — M1990 Unspecified osteoarthritis, unspecified site: Secondary | ICD-10-CM | POA: Diagnosis not present

## 2020-10-14 DIAGNOSIS — K219 Gastro-esophageal reflux disease without esophagitis: Secondary | ICD-10-CM | POA: Diagnosis not present

## 2020-10-14 DIAGNOSIS — I10 Essential (primary) hypertension: Secondary | ICD-10-CM | POA: Diagnosis not present

## 2020-10-14 DIAGNOSIS — E039 Hypothyroidism, unspecified: Secondary | ICD-10-CM | POA: Diagnosis not present

## 2022-07-23 ENCOUNTER — Ambulatory Visit
Admission: RE | Admit: 2022-07-23 | Discharge: 2022-07-23 | Disposition: A | Discharge status: Home / Self Care | Payer: Medicare Other | Source: Ambulatory Visit | Attending: Emergency Medicine | Admitting: Emergency Medicine

## 2022-07-23 ENCOUNTER — Ambulatory Visit (INDEPENDENT_AMBULATORY_CARE_PROVIDER_SITE_OTHER): Payer: Medicare Other

## 2022-07-23 VITALS — BP 127/62 | HR 62 | Temp 99.8°F | Ht 65.0 in | Wt 151.0 lb

## 2022-07-23 DIAGNOSIS — J42 Unspecified chronic bronchitis: Secondary | ICD-10-CM

## 2022-07-23 DIAGNOSIS — I491 Atrial premature depolarization: Secondary | ICD-10-CM

## 2022-07-23 LAB — SARS CORONAVIRUS 2 BY RT PCR: SARS Coronavirus 2 by RT PCR: NEGATIVE

## 2022-07-23 MED ORDER — AEROCHAMBER MV MISC: 1 refills | Status: AC

## 2022-07-23 MED ORDER — AMOXICILLIN-POT CLAVULANATE 875-125 MG PO TABS: 1.0000 | ORAL_TABLET | Freq: Two times a day (BID) | ORAL | 0 refills | Status: AC

## 2022-07-23 MED ORDER — PREDNISONE 20 MG PO TABS: 40.0000 mg | ORAL_TABLET | Freq: Every day | ORAL | 0 refills | Status: AC

## 2022-07-23 MED ORDER — FLUTICASONE PROPIONATE 50 MCG/ACT NA SUSP: 2.0000 | Freq: Every day | NASAL | 0 refills | Status: AC

## 2022-07-23 NOTE — ED Triage Notes (Signed)
Pt c/o cough, chest congestion, runny nose x3 days, no fevers

## 2022-07-23 NOTE — Discharge Instructions (Addendum)
2 puffs from your albuterol inhaler every 4 hours for 2 days, then every 6 hours for 2 days, then as needed.  Use your spacer when you use your albuterol inhaler.  Finish the prednisone and Augmentin, unless your doctor tells you to stop.  Saline nasal irrigation with a Milta Deiters Med rinse and distilled water as often as you want, Flonase to help with the nasal congestion, postnasal drip.  Follow-up with your primary care provider ASAP for referral to cardiology to evaluate your premature atrial contractions.  Go to the ER for the signs and symptoms we discussed

## 2022-07-23 NOTE — ED Provider Notes (Signed)
HPI  SUBJECTIVE:  Dawn Bush is a 88 y.o. female who presents with 2 days of nasal congestion, thick, yellow rhinorrhea, postnasal drip, cough productive of the same material as her nasal congestion, laryngitis, sore throat secondary to postnasal drip, shortness of breath and dyspnea on exertion.  No fevers, body aches, change in her baseline headache, sinus pain or pressure, upper dental pain, facial swelling, wheezing, chest pain, nausea, vomiting, diarrhea, abdominal pain, new lower extremity edema.  No known COVID or flu exposure.  She got 4 doses of the COVID-vaccine.  She has not yet gotten this years flu vaccine.  No antibiotics in the past 3 months.  No antipyretic in the past 6 hours.  She states the nasal congestion is keeping her up at night.  She has been using her albuterol inhaler and taking NyQuil with temporary improvement in her symptoms.  She is also compliant with her Pulmicort.  No aggravating factors.  She has a past medical history of chronic bronchitis, smoked for many years, quit 1994, hypothyroidism, aortic abdominal aneurysm, hypertension, COVID x2, last bout In January 2023. No h/o DM,  chronic kidney disease, atrial fibrillation, arrhythmia.  PCP: In [place].    Past Medical History:  Diagnosis Date   AAA (abdominal aortic aneurysm) (HCC)    Allergy    Arthritis    Chronic bronchitis (HCC)    Colon polyps    GERD (gastroesophageal reflux disease)    Headache    History of blood transfusion    Hyperlipidemia    Hypertension    Osteoporosis    Thyroid disease     Past Surgical History:  Procedure Laterality Date   HIP ARTHROPLASTY Left    NO PAST SURGERIES     SHOULDER ARTHROSCOPY Bilateral     Family History  Problem Relation Age of Onset   Heart disease Father    AAA (abdominal aortic aneurysm) Father    Heart disease Brother    AAA (abdominal aortic aneurysm) Brother    Cancer Neg Hx     Social History   Tobacco Use   Smoking status: Former     Types: Cigarettes    Quit date: 11/02/1991    Years since quitting: 30.7   Smokeless tobacco: Never  Vaping Use   Vaping Use: Never used  Substance Use Topics   Alcohol use: No   Drug use: No    No current facility-administered medications for this encounter.  Current Outpatient Medications:    albuterol (VENTOLIN HFA) 108 (90 Base) MCG/ACT inhaler, Inhale 1-2 puffs into the lungs every 6 (six) hours as needed for wheezing or shortness of breath., Disp: 1 Inhaler, Rfl: 0   amLODipine (NORVASC) 5 MG tablet, TAKE 1 TABLET BY MOUTH EVERY DAY, Disp: 90 tablet, Rfl: 1   amoxicillin-clavulanate (AUGMENTIN) 875-125 MG tablet, Take 1 tablet by mouth every 12 (twelve) hours., Disp: 14 tablet, Rfl: 0   atorvastatin (LIPITOR) 40 MG tablet, TAKE 1 TABLET BY MOUTH EVERY DAY, Disp: 90 tablet, Rfl: 1   celecoxib (CELEBREX) 200 MG capsule, Take by mouth., Disp: , Rfl:    escitalopram (LEXAPRO) 10 MG tablet, TAKE 1 TABLET BY MOUTH EVERY DAY, Disp: 90 tablet, Rfl: 1   fluticasone (FLONASE) 50 MCG/ACT nasal spray, Place 2 sprays into both nostrils daily., Disp: 16 g, Rfl: 0   hyoscyamine (LEVBID) 0.375 MG 12 hr tablet, TAKE 1 TABLET (0.375 MG TOTAL) BY MOUTH DAILY., Disp: 180 tablet, Rfl: 1   levothyroxine (SYNTHROID, LEVOTHROID) 112  MCG tablet, Take 1 tablet (112 mcg total) by mouth daily before breakfast., Disp: 90 tablet, Rfl: 2   meloxicam (MOBIC) 7.5 MG tablet, TAKE 1 TABLET BY MOUTH EVERY DAY, Disp: 90 tablet, Rfl: 0   omeprazole (PRILOSEC) 20 MG capsule, Take 1 capsule (20 mg total) by mouth 2 (two) times daily before a meal., Disp: 180 capsule, Rfl: 2   predniSONE (DELTASONE) 20 MG tablet, Take 2 tablets (40 mg total) by mouth daily with breakfast for 5 days., Disp: 10 tablet, Rfl: 0   Spacer/Aero-Holding Chambers (AEROCHAMBER MV) inhaler, Use as instructed, Disp: 1 each, Rfl: 1   alendronate (FOSAMAX) 70 MG tablet, Take 1 tablet (70 mg total) by mouth once a week. Take with a full glass of water  on an empty stomach., Disp: 4 tablet, Rfl: 3   FLOVENT HFA 110 MCG/ACT inhaler, TAKE 2 PUFFS BY MOUTH TWICE A DAY, Disp: 12 Inhaler, Rfl: 12   metoprolol tartrate (LOPRESSOR) 50 MG tablet, TAKE 1 TABLET BY MOUTH TWICE A DAY, Disp: 180 tablet, Rfl: 0   umeclidinium bromide (INCRUSE ELLIPTA) 62.5 MCG/INH AEPB, Inhale 1 puff into the lungs daily., Disp: 30 each, Rfl: 5  Allergies  Allergen Reactions   Erythromycin Other (See Comments)    Other Reaction: Anxious, jittery Jittery and nervous Other Reaction: Anxious, jittery Jittery and nervous    Other Other (See Comments)    Uncoded Allergy. Allergen: Slo-Bid, Other Reaction: Anxious, jittery   Codeine Nausea And Vomiting   Azithromycin Other (See Comments)   Banana Other (See Comments)    Severe abdominal pain Severe abdominal pain      ROS  As noted in HPI.   Physical Exam  BP 127/62 (BP Location: Left Arm)   Pulse 62   Temp 99.8 F (37.7 C) (Oral)   Ht '5\' 5"'$  (1.651 m)   Wt 68.5 kg   SpO2 95%   BMI 25.13 kg/m   Constitutional: Well developed, well nourished, no acute distress Eyes:  EOMI, conjunctiva normal bilaterally HENT: Normocephalic, atraumatic,mucus membranes moist.  Positive nasal congestion.  Normal turbinates.  No maxillary, frontal sinus tenderness.  No obvious postnasal drip.  Normal oropharynx, normal tonsils without exudates, uvula midline.  Positive cobblestoning.  No obvious postnasal drip. Neck: No cervical lymphadenopathy Respiratory: Normal inspiratory effort, crackles at the bases of the lungs bilaterally.  Fair air movement.  No anterior, lateral chest wall tenderness Cardiovascular: Irregularly irregular, no murmurs, rubs, gallops GI: nondistended skin: No rash, skin intact Musculoskeletal: No lower extremity edema Neurologic: Alert & oriented x 3, no focal neuro deficits Psychiatric: Speech and behavior appropriate   ED Course   Medications - No data to display  Orders Placed This  Encounter  Procedures   SARS Coronavirus 2 by RT PCR (hospital order, performed in [facility] hospital lab) *cepheid single result test* Anterior Nasal Swab    Standing Status:   Standing    Number of Occurrences:   1   DG Chest 2 View    Standing Status:   Standing    Number of Occurrences:   1    Order Specific Question:   Reason for Exam (SYMPTOM  OR DIAGNOSIS REQUIRED)    Answer:   History of chronic bronchitis, productive cough shortness of breath, 2 days   ED EKG    Irregular heartbeat    Standing Status:   Standing    Number of Occurrences:   1    Order Specific Question:   Reason for Exam  Answer:   Other (See Comments)   EKG 12-Lead    Standing Status:   Standing    Number of Occurrences:   1    Results for orders placed or performed during the hospital encounter of 07/23/22 (from the past 24 hour(s))  SARS Coronavirus 2 by RT PCR (hospital order, performed in [facility] hospital lab) *cepheid single result test* Anterior Nasal Swab     Status: None   Collection Time: 07/23/22  3:21 PM   Specimen: Anterior Nasal Swab  Result Value Ref Range   SARS Coronavirus 2 by RT PCR NEGATIVE NEGATIVE   DG Chest 2 View  Result Date: 07/23/2022 CLINICAL DATA:  Chronic bronchitis EXAM: CHEST - 2 VIEW COMPARISON:  None Available. FINDINGS: Hyperinflated lungs. Normal cardiac silhouette. No effusion, infiltrate, or pneumothorax. Degenerative osteophytosis of the spine. Bilateral shoulder prosthetics IMPRESSION: Hyperinflated lungs.  No acute findings. Electronically Signed   By: Suzy Bouchard M.D.   On: 07/23/2022 16:19    ED Clinical Impression  1. Acute exacerbation of chronic bronchitis ([place])   2. Premature atrial contractions      ED Assessment/Plan      Concern for COVID versus acute exacerbation of chronic bronchitis.   will check COVID and get a chest x-ray.  She also has an irregular irregular heartbeat on exam, so checking an EKG.  EKG: Sinus rhythm with PACs,  rate 77.  Normal axis, otherwise normal intervals.  No hypertrophy.  No ST-T wave changes.  No previous EKG for comparison.  Reviewed imaging independently.  Hyperinflation.  No acute findings.  See radiology report for full details.  1.  Acute exacerbation of chronic bronchitis.  COVID-negative.  Chest x-ray negative for pneumonia.  Concern for acute exacerbation of chronic bronchitis.  Plan to send home with Augmentin for 7 days, prednisone 40 mg for 5 days, regularly scheduled albuterol inhaler with a spacer for 4 days, then as needed, Flonase, saline nasal irrigation.  Follow-up with her PCP as needed.  2.  PACs - she also has PACs on EKG.  discussed EKG findings with patient.  Offered to refer to cardiology, but patient is going home to [place].  Advised her to call her PCP and have an immediate referral made in [place].  She agrees with plan.  Discussed labs, imaging, MDM, treatment plan, and plan for follow-up with patient. Discussed sn/sx that should prompt return to the ED. patient agrees with plan.   Meds ordered this encounter  Medications   amoxicillin-clavulanate (AUGMENTIN) 875-125 MG tablet    Sig: Take 1 tablet by mouth every 12 (twelve) hours.    Dispense:  14 tablet    Refill:  0   predniSONE (DELTASONE) 20 MG tablet    Sig: Take 2 tablets (40 mg total) by mouth daily with breakfast for 5 days.    Dispense:  10 tablet    Refill:  0   Spacer/Aero-Holding Chambers (AEROCHAMBER MV) inhaler    Sig: Use as instructed    Dispense:  1 each    Refill:  1   fluticasone (FLONASE) 50 MCG/ACT nasal spray    Sig: Place 2 sprays into both nostrils daily.    Dispense:  16 g    Refill:  0      *This clinic note was created using [employer]. Therefore, there may be occasional mistakes despite careful proofreading.  ?    Melynda Ripple, MD 07/23/22 [phone]
